# Patient Record
Sex: Female | Born: 1945
Health system: Southern US, Community
[De-identification: ages and names within clinical notes are randomized; demographics above are authoritative.]

## PROBLEM LIST (undated history)

## (undated) DIAGNOSIS — E785 Hyperlipidemia, unspecified: Secondary | ICD-10-CM

## (undated) DIAGNOSIS — M810 Age-related osteoporosis without current pathological fracture: Secondary | ICD-10-CM

## (undated) DIAGNOSIS — E1165 Type 2 diabetes mellitus with hyperglycemia: Secondary | ICD-10-CM

## (undated) DIAGNOSIS — IMO0002 Reserved for concepts with insufficient information to code with codable children: Secondary | ICD-10-CM

## (undated) DIAGNOSIS — F0390 Unspecified dementia without behavioral disturbance: Secondary | ICD-10-CM

## (undated) DIAGNOSIS — I10 Essential (primary) hypertension: Secondary | ICD-10-CM

## (undated) HISTORY — DX: Type 2 diabetes mellitus with hyperglycemia: E11.65

## (undated) HISTORY — DX: Age-related osteoporosis without current pathological fracture: M81.0

## (undated) HISTORY — DX: Unspecified dementia, unspecified severity, without behavioral disturbance, psychotic disturbance, mood disturbance, and anxiety: F03.90

## (undated) HISTORY — DX: Essential (primary) hypertension: I10

## (undated) HISTORY — PX: ANKLE SURGERY: SHX546

## (undated) HISTORY — DX: Hyperlipidemia, unspecified: E78.5

## (undated) HISTORY — PX: OTHER SURGICAL HISTORY: SHX169

## (undated) HISTORY — DX: Reserved for concepts with insufficient information to code with codable children: IMO0002

---

## 2017-05-23 ENCOUNTER — Encounter: Payer: Self-pay | Admitting: Adult Health

## 2017-05-23 ENCOUNTER — Ambulatory Visit (INDEPENDENT_AMBULATORY_CARE_PROVIDER_SITE_OTHER): Payer: Medicare Other | Admitting: Adult Health

## 2017-05-23 VITALS — BP 144/72 | Temp 98.8°F | Ht 62.0 in | Wt 127.0 lb

## 2017-05-23 DIAGNOSIS — Z1211 Encounter for screening for malignant neoplasm of colon: Secondary | ICD-10-CM

## 2017-05-23 DIAGNOSIS — Z23 Encounter for immunization: Secondary | ICD-10-CM | POA: Diagnosis not present

## 2017-05-23 DIAGNOSIS — Z780 Asymptomatic menopausal state: Secondary | ICD-10-CM | POA: Diagnosis not present

## 2017-05-23 DIAGNOSIS — Z0000 Encounter for general adult medical examination without abnormal findings: Secondary | ICD-10-CM

## 2017-05-23 NOTE — Patient Instructions (Signed)
It was great meeting you today   Someone will call you to schedule your colonoscopy   Please call the breast center to schedule your mammogram and bone density screen   Follow up with me at your convenience for your physical

## 2017-05-23 NOTE — Progress Notes (Signed)
Patient presents to clinic today to establish care. She is a pleasant 71 year old female who  has no past medical history on file.   She has not been seen by primary care in " many years"    Acute Concerns: Establish Care   Chronic Issues: None   Health Maintenance: Dental -- Does not do routine care  Vision -- Does not do routine care.  Immunizations -- Due for Pneumonia Vaccination.  Colonoscopy --Never had  Mammogram -- Never had  PAP -- ?   Bone Density -- Never had  Diet: Tries to eat healthy. Does not eat a lot of red meat  Exercise: Does not exercise on a regular basis but stay active   History reviewed. No pertinent past medical history.  Past Surgical History:  Procedure Laterality Date  . ANKLE SURGERY Right   . pilonidal cyst      Current Outpatient Medications on File Prior to Visit  Medication Sig Dispense Refill  . aspirin EC 81 MG tablet Take 81 mg daily by mouth.    . Biotin 4782910000 MCG TABS Take 1 tablet daily by mouth.    . Calcium Carbonate-Vit D-Min (CALCIUM-VITAMIN D-MINERALS) 600-800 MG-UNIT CHEW Chew 1 tablet daily by mouth.    . DHA-EPA-Vitamin E (OMEGA-3 COMPLEX PO) Take 1 tablet daily by mouth.    Marland Kitchen. GLUCOSAMINE-CHONDROITIN PO Take by mouth. 1500 mg glucosamine and 1200 mg of choindroitan    . Multiple Vitamins-Minerals (MULTIVITAMIN WOMEN 50+) TABS Take 1 tablet daily by mouth.    . vitamin E 400 UNIT capsule Take 400 Units daily by mouth.     No current facility-administered medications on file prior to visit.     No Known Allergies  Family History  Problem Relation Age of Onset  . Diabetes Mother   . Hypertension Mother   . Hypertension Father   . Heart attack Father   . Hypertension Sister   . Diabetes Brother   . Diabetes Sister     Social History   Socioeconomic History  . Marital status: Unknown    Spouse name: Not on file  . Number of children: Not on file  . Years of education: Not on file  . Highest education level:  Not on file  Social Needs  . Financial resource strain: Not on file  . Food insecurity - worry: Not on file  . Food insecurity - inability: Not on file  . Transportation needs - medical: Not on file  . Transportation needs - non-medical: Not on file  Occupational History  . Not on file  Tobacco Use  . Smoking status: Former Smoker    Packs/day: 2.00    Types: Cigarettes    Start date: 07/11/1964    Last attempt to quit: 07/11/1976    Years since quitting: 40.8  . Smokeless tobacco: Never Used  Substance and Sexual Activity  . Alcohol use: No    Frequency: Never  . Drug use: Not on file  . Sexual activity: Not on file  Other Topics Concern  . Not on file  Social History Narrative   Retired    Not married    No kids           Review of Systems  Constitutional: Negative.   HENT: Negative.   Eyes: Negative.   Respiratory: Negative.   Cardiovascular: Negative.   Gastrointestinal: Negative.   Genitourinary: Negative.   Musculoskeletal: Negative.   Skin: Negative.   Neurological: Negative.  Endo/Heme/Allergies: Negative.     BP (!) 144/72 (BP Location: Left Arm)   Temp 98.8 F (37.1 C) (Oral)   Ht 5\' 2"  (1.575 m)   Wt 127 lb (57.6 kg)   BMI 23.23 kg/m   Physical Exam  Constitutional: She is oriented to person, place, and time and well-developed, well-nourished, and in no distress. No distress.  HENT:  Head: Normocephalic and atraumatic.  Right Ear: External ear normal.  Left Ear: External ear normal.  Nose: Nose normal.  Mouth/Throat: Oropharynx is clear and moist. No oropharyngeal exudate.  Eyes: Conjunctivae and EOM are normal. Pupils are equal, round, and reactive to light. Right eye exhibits no discharge. Left eye exhibits no discharge. No scleral icterus.  Neck: Normal range of motion. Neck supple. No JVD present. No tracheal deviation present. No thyromegaly present.  Cardiovascular: Normal rate, regular rhythm, normal heart sounds and intact distal pulses.  Exam reveals no gallop and no friction rub.  No murmur heard. Pulmonary/Chest: Effort normal and breath sounds normal. No stridor. No respiratory distress. She has no wheezes. She has no rales. She exhibits no tenderness.  Abdominal: Soft. Bowel sounds are normal. She exhibits no distension and no mass. There is no tenderness. There is no rebound and no guarding.  Musculoskeletal: Normal range of motion. She exhibits no edema, tenderness or deformity.  Lymphadenopathy:    She has no cervical adenopathy.  Neurological: She is alert and oriented to person, place, and time. She displays normal reflexes. No cranial nerve deficit. She exhibits normal muscle tone. Gait normal. Coordination normal. GCS score is 15.  Skin: Skin is warm and dry. No rash noted. She is not diaphoretic. No erythema. No pallor.  Psychiatric: Mood, memory, affect and judgment normal.  Nursing note and vitals reviewed.  Assessment/Plan: 1. Encounter for medical examination to establish care - Follow up for CPE  - Follow up sooner with any acute issues  - Continue to stay active and eat healthy  - Recommend some type of weight training for bone health   2. Screen for colon cancer  - Ambulatory referral to Gastroenterology  3. Post-menopausal  - DG Bone Density; Future - MM DIGITAL SCREENING BILATERAL; Future  4. Need for vaccination against Streptococcus pneumoniae  - Pneumococcal conjugate vaccine 13-valent    Shirline Freesory Yazlin Ekblad, NP

## 2017-05-24 ENCOUNTER — Other Ambulatory Visit: Payer: Self-pay | Admitting: Adult Health

## 2017-05-24 DIAGNOSIS — E2839 Other primary ovarian failure: Secondary | ICD-10-CM

## 2017-05-24 DIAGNOSIS — Z1231 Encounter for screening mammogram for malignant neoplasm of breast: Secondary | ICD-10-CM

## 2017-06-20 ENCOUNTER — Encounter: Payer: Medicare Other | Admitting: Adult Health

## 2017-06-27 ENCOUNTER — Ambulatory Visit
Admission: RE | Admit: 2017-06-27 | Discharge: 2017-06-27 | Disposition: A | Discharge status: Home / Self Care | Payer: Medicare Other | Source: Ambulatory Visit | Attending: Adult Health | Admitting: Adult Health

## 2017-06-27 DIAGNOSIS — E2839 Other primary ovarian failure: Secondary | ICD-10-CM

## 2017-06-27 DIAGNOSIS — Z1231 Encounter for screening mammogram for malignant neoplasm of breast: Secondary | ICD-10-CM

## 2017-06-28 ENCOUNTER — Encounter: Payer: Self-pay | Admitting: Adult Health

## 2017-07-26 ENCOUNTER — Telehealth: Payer: Self-pay | Admitting: Adult Health

## 2017-07-26 NOTE — Telephone Encounter (Signed)
Noted  

## 2017-07-26 NOTE — Telephone Encounter (Signed)
Called pt to schedule for Prolia injection. Pt's summary of benefits shows that pt is responsible for 20% of the cost of the injection (about $230) every 6 months.    Pt may apply for a grant with [employer] [phone]  Left message on voicemail to call office. Will follow-up with pt at a later time.

## 2017-07-26 NOTE — Telephone Encounter (Signed)
Sir please note below message.

## 2017-07-26 NOTE — Telephone Encounter (Signed)
Copied from CRM [phone]. Topic: Quick Communication - Office Called Patient >> Jul 26, 2017  4:17 PM Susan Schmidt, Susan Schmidt wrote: Reason for CRM: Patient returned office called but she have decide not to take the Prolia injection. She stated that she read up on the injection and it has too many side effect.

## 2017-07-26 NOTE — Telephone Encounter (Signed)
For review and [facility] to [facility].

## 2018-01-01 ENCOUNTER — Other Ambulatory Visit: Payer: Self-pay | Admitting: Adult Health

## 2018-01-01 ENCOUNTER — Ambulatory Visit (INDEPENDENT_AMBULATORY_CARE_PROVIDER_SITE_OTHER): Payer: Medicare Other | Admitting: Adult Health

## 2018-01-01 ENCOUNTER — Encounter: Payer: Self-pay | Admitting: Adult Health

## 2018-01-01 VITALS — BP 160/90 | Temp 98.6°F | Ht 61.75 in | Wt 128.0 lb

## 2018-01-01 DIAGNOSIS — R7309 Other abnormal glucose: Secondary | ICD-10-CM

## 2018-01-01 DIAGNOSIS — Z1159 Encounter for screening for other viral diseases: Secondary | ICD-10-CM | POA: Diagnosis not present

## 2018-01-01 DIAGNOSIS — Z1211 Encounter for screening for malignant neoplasm of colon: Secondary | ICD-10-CM

## 2018-01-01 DIAGNOSIS — I10 Essential (primary) hypertension: Secondary | ICD-10-CM | POA: Diagnosis not present

## 2018-01-01 DIAGNOSIS — M810 Age-related osteoporosis without current pathological fracture: Secondary | ICD-10-CM

## 2018-01-01 DIAGNOSIS — Z0000 Encounter for general adult medical examination without abnormal findings: Secondary | ICD-10-CM

## 2018-01-01 LAB — BASIC METABOLIC PANEL
BUN: 8 mg/dL (ref 6–23)
CALCIUM: 10 mg/dL (ref 8.4–10.5)
CHLORIDE: 100 meq/L (ref 96–112)
CO2: 30 mEq/L (ref 19–32)
CREATININE: 0.67 mg/dL (ref 0.40–1.20)
GFR: 91.6 mL/min (ref >60.00)
Glucose, Bld: 152 mg/dL — ABNORMAL HIGH (ref 70–99)
Potassium: 4.3 mEq/L (ref 3.5–5.1)
Sodium: 138 mEq/L (ref 135–145)

## 2018-01-01 LAB — LIPID PANEL
CHOL/HDL RATIO: 4
CHOLESTEROL: 211 mg/dL — AB (ref 0–200)
HDL: 58.6 mg/dL (ref >39.00)
LDL CALC: 119 mg/dL — AB (ref 0–99)
NonHDL: 151.9
Triglycerides: 167 mg/dL — ABNORMAL HIGH (ref 0.0–149.0)
VLDL: 33.4 mg/dL (ref 0.0–40.0)

## 2018-01-01 LAB — HEPATIC FUNCTION PANEL
ALT: 18 U/L (ref 0–35)
AST: 20 U/L (ref 0–37)
Albumin: 4.4 g/dL (ref 3.5–5.2)
Alkaline Phosphatase: 64 U/L (ref 39–117)
BILIRUBIN DIRECT: 0.1 mg/dL (ref 0.0–0.3)
TOTAL PROTEIN: 7.6 g/dL (ref 6.0–8.3)
Total Bilirubin: 0.6 mg/dL (ref 0.2–1.2)

## 2018-01-01 LAB — CBC WITH DIFFERENTIAL/PLATELET
BASOS PCT: 0.5 % (ref 0.0–3.0)
Basophils Absolute: 0 10*3/uL (ref 0.0–0.1)
EOS ABS: 0 10*3/uL (ref 0.0–0.7)
Eosinophils Relative: 0.6 % (ref 0.0–5.0)
HCT: 42.8 % (ref 36.0–46.0)
HEMOGLOBIN: 14.9 g/dL (ref 12.0–15.0)
Lymphocytes Relative: 32.2 % (ref 12.0–46.0)
Lymphs Abs: 2.4 10*3/uL (ref 0.7–4.0)
MCHC: 34.9 g/dL (ref 30.0–36.0)
MCV: 90.5 fl (ref 78.0–100.0)
MONO ABS: 0.5 10*3/uL (ref 0.1–1.0)
Monocytes Relative: 6.2 % (ref 3.0–12.0)
Neutro Abs: 4.4 10*3/uL (ref 1.4–7.7)
Neutrophils Relative %: 60.5 % (ref 43.0–77.0)
Platelets: 199 10*3/uL (ref 150.0–400.0)
RBC: 4.73 Mil/uL (ref 3.87–5.11)
RDW: 12.6 % (ref 11.5–15.5)
WBC: 7.3 10*3/uL (ref 4.0–10.5)

## 2018-01-01 LAB — VITAMIN D 25 HYDROXY (VIT D DEFICIENCY, FRACTURES): VITD: 51.78 ng/mL (ref 30.00–100.00)

## 2018-01-01 LAB — TSH: TSH: 3.33 u[IU]/mL (ref 0.35–4.50)

## 2018-01-01 NOTE — Progress Notes (Signed)
Subjective:    Patient ID: Susan Schmidt, female    DOB: May 02, 1946, 72 y.o.   MRN: [id number]  HPI Patient presents for yearly preventative medicine examination. She is a pleasant 72 year old female who  has a past medical history of Osteoporosis.  Osteoporosis - Dexa Scan in 2018 showed the BMD measured at [facility] Spine L2-L4 is 0.730 g/cm2 with a T-score of -3.9.  Is recommended to start Prolia, she ultimately ended up deciding to not forego this treatment due to the side effects of the medication. She is taking Vitamin D 1000 units daily.   All immunizations and health maintenance protocols were reviewed with the patient and needed orders were placed. She is in need of tetanus booster but insurance will not cover.   Appropriate screening laboratory values were ordered for the patient including screening of hyperlipidemia, renal function and hepatic function.  Medication reconciliation,  past medical history, social history, problem list and allergies were reviewed in detail with the patient  Goals were established with regard to weight loss, exercise, and  diet in compliance with medications. She eats a healthy diet and is active with yard work. She does not do formal exercising.   End of life planning was discussed.  She does not participate in routine dental or vision screens.  Her mammogram in 2018 which was negative for malignancy.  She has no acute complaints.   Review of Systems  Constitutional: Negative.   HENT: Negative.   Eyes: Negative.   Respiratory: Negative.   Cardiovascular: Negative.   Gastrointestinal: Negative.   Endocrine: Negative.   Genitourinary: Negative.   Musculoskeletal: Negative.   Skin: Negative.   Allergic/Immunologic: Negative.   Neurological: Negative.   Hematological: Negative.   Psychiatric/Behavioral: Negative.    Past Medical History:  Diagnosis Date  . Osteoporosis     Social History   Socioeconomic History  . Marital status:  Single    Spouse name: Not on file  . Number of children: Not on file  . Years of education: Not on file  . Highest education level: Not on file  Occupational History  . Not on file  Social Needs  . Financial resource strain: Not on file  . Food insecurity:    Worry: Not on file    Inability: Not on file  . Transportation needs:    Medical: Not on file    Non-medical: Not on file  Tobacco Use  . Smoking status: Former Smoker    Packs/day: 2.00    Types: Cigarettes    Start date: 07/11/1964    Last attempt to quit: 07/11/1976    Years since quitting: 41.5  . Smokeless tobacco: Never Used  Substance and Sexual Activity  . Alcohol use: No    Frequency: Never  . Drug use: Not on file  . Sexual activity: Not on file  Lifestyle  . Physical activity:    Days per week: Not on file    Minutes per session: Not on file  . Stress: Not on file  Relationships  . Social connections:    Talks on phone: Not on file    Gets together: Not on file    Attends religious service: Not on file    Active member of club or organization: Not on file    Attends meetings of clubs or organizations: Not on file    Relationship status: Not on file  . Intimate partner violence:    Fear of current or ex partner:  Not on file    Emotionally abused: Not on file    Physically abused: Not on file    Forced sexual activity: Not on file  Other Topics Concern  . Not on file  Social History Narrative   Retired    Not married    No kids           Past Surgical History:  Procedure Laterality Date  . ANKLE SURGERY Right   . pilonidal cyst      Family History  Problem Relation Age of Onset  . Diabetes Mother   . Hypertension Mother   . Hypertension Father   . Heart attack Father   . Hypertension Sister   . Diabetes Brother   . Diabetes Sister     No Known Allergies  Current Outpatient Medications on File Prior to Visit  Medication Sig Dispense Refill  . aspirin EC 81 MG tablet Take 81 mg  daily by mouth.    . Biotin 40981 MCG TABS Take 1 tablet daily by mouth.    . Calcium Carbonate-Vit D-Min (CALCIUM-VITAMIN D-MINERALS) 600-800 MG-UNIT CHEW Chew 1 tablet daily by mouth.    . Cholecalciferol (VITAMIN D3) 1000 units CAPS Take 1 capsule by mouth daily.    Marland Kitchen GLUCOSAMINE-CHONDROITIN PO Take by mouth. 1500 mg glucosamine and 1200 mg of choindroitan    . Multiple Vitamins-Minerals (MULTIVITAMIN WOMEN 50+) TABS Take 1 tablet daily by mouth.    . Omega-3 Fatty Acids (SUPER OMEGA 3 EPA/DHA PO) Take 660 mg by mouth daily.    . vitamin E 400 UNIT capsule Take 400 Units daily by mouth.     No current facility-administered medications on file prior to visit.     BP (!) 160/90   Temp 98.6 F (37 C) (Oral)   Ht 5' 1.75" (1.568 m)   Wt 128 lb (58.1 kg)   BMI 23.60 kg/m       Objective:   Physical Exam  Constitutional: She is oriented to person, place, and time. She appears well-developed and well-nourished. No distress.  HENT:  Head: Normocephalic and atraumatic.  Right Ear: External ear normal.  Left Ear: External ear normal.  Nose: Nose normal.  Mouth/Throat: Oropharynx is clear and moist. Abnormal dentition. No oropharyngeal exudate.  Eyes: Pupils are equal, round, and reactive to light. Conjunctivae and EOM are normal. Right eye exhibits no discharge. Left eye exhibits no discharge. No scleral icterus.  Neck: Normal range of motion. Neck supple. No JVD present. No tracheal deviation present. No thyromegaly present.  Cardiovascular: Normal rate, regular rhythm, normal heart sounds and intact distal pulses. Exam reveals no gallop and no friction rub.  No murmur heard. Pulmonary/Chest: Effort normal and breath sounds normal. No stridor. No respiratory distress. She has no wheezes. She has no rales. She exhibits no mass and no tenderness. Right breast exhibits no inverted nipple, no mass, no nipple discharge, no skin change and no tenderness. Left breast exhibits no inverted  nipple, no mass, no nipple discharge and no tenderness.  Abdominal: Soft. Bowel sounds are normal. She exhibits no distension and no mass. There is no tenderness. There is no rebound and no guarding. No hernia.  Musculoskeletal: Normal range of motion. She exhibits no edema, tenderness or deformity.  Lymphadenopathy:    She has no cervical adenopathy.  Neurological: She is alert and oriented to person, place, and time. She displays normal reflexes. No cranial nerve deficit or sensory deficit. She exhibits normal muscle tone. Coordination normal.  Skin:  Skin is warm and dry. Capillary refill takes less than 2 seconds. No rash noted. She is not diaphoretic. No erythema. No pallor.  Psychiatric: She has a normal mood and affect. Her behavior is normal. Judgment and thought content normal.  Nursing note and vitals reviewed.     Assessment & Plan:  1. Routine general medical examination at a health care facility - Encouraged heart healthy diet and exercise  - Follow up in one year or sooner if needed - Basic metabolic panel - CBC with Differential/Platelet - Hepatic function panel - Lipid panel - TSH  2. Age-related osteoporosis without current pathological fracture  - TSH - Vitamin D, 25-hydroxy  3. Colon cancer screening  - Ambulatory referral to Gastroenterology  4. Need for hepatitis C screening test  - Hep C Antibody  5. Essential hypertension - She is going to monitor her BP at home over the next two weeks and send me the results via mychart.  - Consider adding agent  - Basic metabolic panel - CBC with Differential/Platelet - Hepatic function panel - Lipid panel - TSH  Shirline Freesory Juletta Berhe, NP

## 2018-01-01 NOTE — Patient Instructions (Signed)
It was great seeing you today  I will follow up with you regarding your blood work   Please get a blood pressure cuff and monitor your blood pressure at home, twice a day. Please send me the results via Mychart in two weeks   If you need anything, please let me know

## 2018-01-01 NOTE — Addendum Note (Signed)
Addended by: Laure KidneyLARK, Reino Lybbert J on: 01/01/2018 05:13 PM   Modules accepted: Orders

## 2018-01-02 ENCOUNTER — Other Ambulatory Visit (INDEPENDENT_AMBULATORY_CARE_PROVIDER_SITE_OTHER): Payer: Medicare Other

## 2018-01-02 ENCOUNTER — Other Ambulatory Visit: Payer: Self-pay | Admitting: Family Medicine

## 2018-01-02 DIAGNOSIS — R7309 Other abnormal glucose: Secondary | ICD-10-CM | POA: Diagnosis not present

## 2018-01-02 LAB — HEPATITIS C ANTIBODY
Hepatitis C Ab: NONREACTIVE
SIGNAL TO CUT-OFF: 0.02 (ref <1.00)

## 2018-01-02 LAB — HEMOGLOBIN A1C: HEMOGLOBIN A1C: 7.6 % — AB (ref 4.6–6.5)

## 2018-01-02 MED ORDER — METFORMIN HCL 500 MG PO TABS: 500.0000 mg | ORAL_TABLET | Freq: Every day | ORAL | 0 refills | Status: DC

## 2018-02-21 ENCOUNTER — Telehealth: Payer: Self-pay | Admitting: Adult Health

## 2018-02-21 DIAGNOSIS — R7309 Other abnormal glucose: Secondary | ICD-10-CM

## 2018-02-21 NOTE — Telephone Encounter (Signed)
Copied from CRM [id number]. Topic: Quick Communication - See Telephone Encounter >> Feb 21, 2018  3:41 PM Lorrine KinMcGee, Whittney Steenson B, [place] wrote: CRM for notification. See Telephone encounter for: 02/21/18. Patient calling and states that Kandee KeenCory wanted her to get back with him on her blood pressures. States that she had forgotten to get back with him, but wanted to let him know what the last 2 weeks of blood pressures were. States that she takes the blood pressures in the morning and then again in the evening. 02/08/18- 130/65 125/61 02/09/18- 124/63 135/67 02/10/18- 128/61 124/60 02/11/18- 143/66 123/65 02/12/18- 135/66 147/71 02/13/18- 132/68 120/62 02/14/18- 128/65 139/66 02/15/18- 131/66 128/63 02/16/18- 128/66 137/69 02/17/18- 129/63 118/60 02/18/18- 134/62 121/61 02/19/18- 136/66 142/66 02/20/18- 140/63 136/67 02/21/18- 135/67 131/66   Patient is also requesting a prescription for a glucometer and glucose testing supplies be written so she could check her blood sugar at home. Please advise. [facility] - [place] - [address]

## 2018-02-22 NOTE — Telephone Encounter (Signed)
Patient returning call to Misty. Please advise.  

## 2018-02-22 NOTE — Telephone Encounter (Signed)
BP at home is ok, do not need to start on medication   OK to send in glucometer and testing strips. Test BID

## 2018-02-22 NOTE — Telephone Encounter (Signed)
Left a message for a return call.

## 2018-02-22 NOTE — Telephone Encounter (Signed)
Pt called back stating that her insurance will cover accu-check and one touch. She stated that she prefers one touch. Insurance will cover a 90 day supply.

## 2018-02-22 NOTE — Telephone Encounter (Signed)
Spoke to the pt and informed her of Cory's message.  She will now call her insurance company to find out which glucometer and test strips are covered.  Will call back.

## 2018-02-23 MED ORDER — GLUCOSE BLOOD VI STRP: ORAL_STRIP | 12 refills | Status: DC

## 2018-02-23 MED ORDER — ONETOUCH ULTRASOFT LANCETS MISC: 12 refills | Status: DC

## 2018-02-23 MED ORDER — ONETOUCH ULTRA 2 W/DEVICE KIT: PACK | 0 refills | Status: DC

## 2018-02-23 NOTE — Telephone Encounter (Signed)
rx for the testing supplies have been sent to the pharmacy.  Nothing further is needed.

## 2018-02-23 NOTE — Addendum Note (Signed)
Addended by: Marcellus ScottADKINS, Dominiq Fontaine L on: 02/23/2018 08:41 AM   Modules accepted: Orders

## 2018-03-27 ENCOUNTER — Ambulatory Visit (INDEPENDENT_AMBULATORY_CARE_PROVIDER_SITE_OTHER): Payer: Medicare Other | Admitting: Adult Health

## 2018-03-27 ENCOUNTER — Encounter: Payer: Self-pay | Admitting: Adult Health

## 2018-03-27 DIAGNOSIS — E785 Hyperlipidemia, unspecified: Secondary | ICD-10-CM | POA: Insufficient documentation

## 2018-03-27 DIAGNOSIS — E1165 Type 2 diabetes mellitus with hyperglycemia: Secondary | ICD-10-CM

## 2018-03-27 DIAGNOSIS — E782 Mixed hyperlipidemia: Secondary | ICD-10-CM

## 2018-03-27 DIAGNOSIS — IMO0002 Reserved for concepts with insufficient information to code with codable children: Secondary | ICD-10-CM | POA: Insufficient documentation

## 2018-03-27 LAB — BASIC METABOLIC PANEL
BUN: 8 mg/dL (ref 6–23)
CALCIUM: 10.1 mg/dL (ref 8.4–10.5)
CO2: 26 meq/L (ref 19–32)
Chloride: 101 mEq/L (ref 96–112)
Creatinine, Ser: 0.59 mg/dL (ref 0.40–1.20)
GFR: 106.01 mL/min (ref >60.00)
Glucose, Bld: 121 mg/dL — ABNORMAL HIGH (ref 70–99)
Potassium: 4.7 mEq/L (ref 3.5–5.1)
SODIUM: 137 meq/L (ref 135–145)

## 2018-03-27 LAB — LIPID PANEL
CHOLESTEROL: 188 mg/dL (ref 0–200)
HDL: 52 mg/dL (ref >39.00)
LDL CALC: 114 mg/dL — AB (ref 0–99)
NonHDL: 135.5
Total CHOL/HDL Ratio: 4
Triglycerides: 109 mg/dL (ref 0.0–149.0)
VLDL: 21.8 mg/dL (ref 0.0–40.0)

## 2018-03-27 LAB — HEMOGLOBIN A1C: HEMOGLOBIN A1C: 5.9 % (ref 4.6–6.5)

## 2018-03-27 NOTE — Progress Notes (Signed)
Subjective:    Patient ID: Susan Schmidt, female    DOB: 04-12-1946, 72 y.o.   MRN: [id number]  HPI  72 year old female who  has a past medical history of DM (diabetes mellitus), type 2, uncontrolled ([place]), Hyperlipidemia, and Osteoporosis.  She presents to the office today for 69-monthfollow-up regarding new diagnosis of diabetes and hyperlipidemia.  During her CPE, approximately 3 months ago she was found to have an A1c of 7.6 -she was subsequently started on metformin 500 mg daily with breakfast.  She was advised to monitor her blood sugars at home and started eating a heart healthy diet.  Today in the office she reports that she has had no side effects with metformin. She is monitoring her blood sugars at home and reports readings between 80 -150.   She has been working on diet and has cut back on sugars and carbs.  She is exercising on a regular basis ( walking about a mile).   Wt Readings from Last 3 Encounters:  03/27/18 115 lb (52.2 kg)  01/01/18 128 lb (58.1 kg)  05/23/17 127 lb (57.6 kg)    Review of Systems See HPI   Past Medical History:  Diagnosis Date  . DM (diabetes mellitus), [place] 2, [place]   . Hyperlipidemia   . Osteoporosis     Social History   Socioeconomic History  . Marital status: Single    Spouse name: Not on file  . Number of children: Not on file  . Years of education: Not on file  . Highest education level: Not on file  Occupational History  . Not on file  Social Needs  . Financial resource strain: Not on file  . Food insecurity:    Worry: Not on file    Inability: Not on file  . Transportation needs:    Medical: Not on file    Non-medical: Not on file  Tobacco Use  . Smoking status: Former Smoker    Packs/day: 2.00    Types: Cigarettes    Start date: 07/11/1964    Last attempt to quit: 07/11/1976    Years since quitting: 41.7  . Smokeless tobacco: Never Used  Substance and Sexual Activity  . Alcohol use: No   Frequency: Never  . Drug use: Not on file  . Sexual activity: Not on file  Lifestyle  . Physical activity:    Days per week: Not on file    Minutes per session: Not on file  . Stress: Not on file  Relationships  . Social connections:    Talks on phone: Not on file    Gets together: Not on file    Attends religious service: Not on file    Active member of club or organization: Not on file    Attends meetings of clubs or organizations: Not on file    Relationship status: Not on file  . Intimate partner violence:    Fear of current or ex partner: Not on file    Emotionally abused: Not on file    Physically abused: Not on file    Forced sexual activity: Not on file  Other Topics Concern  . Not on file  Social History Narrative   Retired    Not married    No kids           Past Surgical History:  Procedure Laterality Date  . ANKLE SURGERY Right   . pilonidal cyst      Family History  Problem Relation Age of Onset  . Diabetes Mother   . Hypertension Mother   . Hypertension Father   . Heart attack Father   . Hypertension Sister   . Diabetes Brother   . Diabetes Sister     No Known Allergies  Current Outpatient Medications on File Prior to Visit  Medication Sig Dispense Refill  . aspirin EC 81 MG tablet Take 81 mg daily by mouth.    . Biotin 10000 MCG TABS Take 1 tablet daily by mouth.    . Blood Glucose Monitoring Suppl (ONE TOUCH ULTRA 2) w/Device KIT Test blood sugar twice daily 1 each 0  . Calcium Carbonate-Vit D-Min (CALCIUM-VITAMIN D-MINERALS) 600-800 MG-UNIT CHEW Chew 1 tablet daily by mouth.    . Cholecalciferol (VITAMIN D3) 1000 units CAPS Take 1 capsule by mouth daily.    Marland Kitchen GLUCOSAMINE-CHONDROITIN PO Take by mouth. 1500 mg glucosamine and 1200 mg of choindroitan    . glucose blood (ONE TOUCH ULTRA TEST) test strip Test blood sugar twice daily 100 each 12  . Lancets (ONETOUCH ULTRASOFT) lancets Use as instructed 100 each 12  . metFORMIN (GLUCOPHAGE) 500 MG  tablet Take 1 tablet (500 mg total) by mouth daily with breakfast. 90 tablet 0  . Multiple Vitamins-Minerals (MULTIVITAMIN WOMEN 50+) TABS Take 1 tablet daily by mouth.    . Omega-3 Fatty Acids (SUPER OMEGA 3 EPA/DHA PO) Take 660 mg by mouth daily.    . vitamin E 400 UNIT capsule Take 400 Units daily by mouth.     No current facility-administered medications on file prior to visit.     There were no vitals taken for this visit.  BP Readings from Last 3 Encounters:  03/27/18 128/76  01/01/18 (!) 160/90  05/23/17 (!) 144/72       Objective:   Physical Exam  Constitutional: She is oriented to person, place, and time. She appears well-developed and well-nourished. No distress.  Cardiovascular: Normal rate, regular rhythm, normal heart sounds and intact distal pulses.  Pulmonary/Chest: Effort normal and breath sounds normal.  Musculoskeletal: Normal range of motion.  Neurological: She is alert and oriented to person, place, and time.  Skin: Skin is warm and dry. She is not diaphoretic.  Psychiatric: She has a normal mood and affect. Her behavior is normal. Judgment and thought content normal.  Vitals reviewed.     Assessment & Plan:  1. Uncontrolled type 2 diabetes mellitus with hyperglycemia (HCC) - Hemoglobin V7Q - Basic Metabolic Panel  2. Mixed hyperlipidemia  - Lipid panel  Dorothyann Peng, NP

## 2018-03-28 ENCOUNTER — Other Ambulatory Visit: Payer: Self-pay | Admitting: Adult Health

## 2018-03-28 MED ORDER — METFORMIN HCL 500 MG PO TABS: 500.0000 mg | ORAL_TABLET | Freq: Every day | ORAL | 0 refills | Status: DC

## 2018-03-28 NOTE — Telephone Encounter (Signed)
Sent to the pharmacy by e-scribe. 

## 2018-05-29 ENCOUNTER — Other Ambulatory Visit: Payer: Self-pay | Admitting: Adult Health

## 2018-05-29 DIAGNOSIS — Z1231 Encounter for screening mammogram for malignant neoplasm of breast: Secondary | ICD-10-CM

## 2018-06-25 ENCOUNTER — Ambulatory Visit (INDEPENDENT_AMBULATORY_CARE_PROVIDER_SITE_OTHER): Payer: Medicare Other | Admitting: Adult Health

## 2018-06-25 ENCOUNTER — Encounter: Payer: Self-pay | Admitting: Adult Health

## 2018-06-25 VITALS — BP 140/70 | Temp 98.3°F | Wt 104.0 lb

## 2018-06-25 DIAGNOSIS — E1165 Type 2 diabetes mellitus with hyperglycemia: Secondary | ICD-10-CM

## 2018-06-25 LAB — POCT GLYCOSYLATED HEMOGLOBIN (HGB A1C): HBA1C, POC (CONTROLLED DIABETIC RANGE): 5.4 % (ref 0.0–7.0)

## 2018-06-25 MED ORDER — METFORMIN HCL 500 MG PO TABS: 250.0000 mg | ORAL_TABLET | Freq: Every day | ORAL | 1 refills | Status: DC

## 2018-06-25 NOTE — Progress Notes (Signed)
Subjective:    Patient ID: Susan Schmidt, female    DOB: 1945/09/05, 72 y.o.   MRN: [id number]  HPI 72 year old female who  has a past medical history of DM (diabetes mellitus), type 2, uncontrolled ([place]), Hyperlipidemia, and Osteoporosis.  She presents to the office today for three month follow up regarding diabetes .  Is currently maintained on metformin 500 mg daily.  Her last A1c was 5.9, this was down from 7.6 - 3 months prior.  She has been monitoring her blood sugars at home and reports readings between 80 and 130. She has had one reading of 200.     She denies any side effects of metformin but does report frequent episodes of feeling as though her blood sugars have dropped too low.   She has been working on diet and has cut back on sugars and carbs.    Wt Readings from Last 3 Encounters:  06/25/18 104 lb (47.2 kg)  03/27/18 115 lb (52.2 kg)  01/01/18 128 lb (58.1 kg)    Review of Systems See HPI   Past Medical History:  Diagnosis Date  . DM (diabetes mellitus), type 2, uncontrolled ([place])   . Hyperlipidemia   . Osteoporosis     Social History   Socioeconomic History  . Marital status: Single    Spouse name: Not on file  . Number of children: Not on file  . Years of education: Not on file  . Highest education level: Not on file  Occupational History  . Not on file  Social Needs  . Financial resource strain: Not on file  . Food insecurity:    Worry: Not on file    Inability: Not on file  . Transportation needs:    Medical: Not on file    Non-medical: Not on file  Tobacco Use  . Smoking status: Former Smoker    Packs/day: 2.00    Types: Cigarettes    Start date: 07/11/1964    Last attempt to quit: 07/11/1976    Years since quitting: 41.9  . Smokeless tobacco: Never Used  Substance and Sexual Activity  . Alcohol use: No    Frequency: Never  . Drug use: Not on file  . Sexual activity: Not on file  Lifestyle  . Physical activity:    Days per week:  Not on file    Minutes per session: Not on file  . Stress: Not on file  Relationships  . Social connections:    Talks on phone: Not on file    Gets together: Not on file    Attends religious service: Not on file    Active member of club or organization: Not on file    Attends meetings of clubs or organizations: Not on file    Relationship status: Not on file  . Intimate partner violence:    Fear of current or ex partner: Not on file    Emotionally abused: Not on file    Physically abused: Not on file    Forced sexual activity: Not on file  Other Topics Concern  . Not on file  Social History Narrative   Retired    Not married    No kids           Past Surgical History:  Procedure Laterality Date  . ANKLE SURGERY Right   . pilonidal cyst      Family History  Problem Relation Age of Onset  . Diabetes Mother   . Hypertension  Mother   . Hypertension Father   . Heart attack Father   . Hypertension Sister   . Diabetes Brother   . Diabetes Sister     No Known Allergies  Current Outpatient Medications on File Prior to Visit  Medication Sig Dispense Refill  . aspirin EC 81 MG tablet Take 81 mg daily by mouth.    . Biotin 10000 MCG TABS Take 1 tablet daily by mouth.    . Blood Glucose Monitoring Suppl (ONE TOUCH ULTRA 2) w/Device KIT Test blood sugar twice daily 1 each 0  . Calcium Carbonate-Vit D-Min (CALCIUM-VITAMIN D-MINERALS) 600-800 MG-UNIT CHEW Chew 1 tablet daily by mouth.    . Cholecalciferol (VITAMIN D3) 1000 units CAPS Take 1 capsule by mouth daily.    Marland Kitchen GLUCOSAMINE-CHONDROITIN PO Take by mouth. 1500 mg glucosamine and 1200 mg of choindroitan    . glucose blood (ONE TOUCH ULTRA TEST) test strip Test blood sugar twice daily 100 each 12  . Lancets (ONETOUCH ULTRASOFT) lancets Use as instructed 100 each 12  . metFORMIN (GLUCOPHAGE) 500 MG tablet Take 1 tablet (500 mg total) by mouth daily with breakfast. 90 tablet 0  . Multiple Vitamins-Minerals (MULTIVITAMIN WOMEN  50+) TABS Take 1 tablet daily by mouth.    . Omega-3 Fatty Acids (SUPER OMEGA 3 EPA/DHA PO) Take 660 mg by mouth daily.    . vitamin E 400 UNIT capsule Take 400 Units daily by mouth.     No current facility-administered medications on file prior to visit.     There were no vitals taken for this visit.      Objective:   Physical Exam Vitals signs and nursing note reviewed.  Constitutional:      General: She is not in acute distress.    Appearance: Normal appearance. She is well-developed and normal weight.  Neck:     Thyroid: No thyromegaly.     Trachea: No tracheal deviation.  Cardiovascular:     Rate and Rhythm: Normal rate and regular rhythm.     Heart sounds: Normal heart sounds. No murmur. No friction rub. No gallop.   Pulmonary:     Effort: Pulmonary effort is normal. No respiratory distress.     Breath sounds: Normal breath sounds. No wheezing or rales.  Lymphadenopathy:     Cervical: No cervical adenopathy.  Skin:    General: Skin is warm and dry.     Capillary Refill: Capillary refill takes less than 2 seconds.     Coloration: Skin is not pale.     Findings: No erythema or rash.  Neurological:     General: No focal deficit present.     Mental Status: She is alert and oriented to person, place, and time. Mental status is at baseline.     Cranial Nerves: No cranial nerve deficit.     Coordination: Coordination normal.  Psychiatric:        Mood and Affect: Mood normal.        Behavior: Behavior normal.        Thought Content: Thought content normal.        Judgment: Judgment normal.       Assessment & Plan:  1. Uncontrolled type 2 diabetes mellitus with hyperglycemia (HCC)  - POCT A1C- 5.4. Has improved.  - Will decrease Metformin to 250 mg daily.  - Follow up in 3 months  - metFORMIN (GLUCOPHAGE) 500 MG tablet; Take 0.5 tablets (250 mg total) by mouth daily with breakfast.  Dispense: 45 tablet;  Refill: 1

## 2018-07-16 ENCOUNTER — Ambulatory Visit
Admission: RE | Admit: 2018-07-16 | Discharge: 2018-07-16 | Disposition: A | Discharge status: Home / Self Care | Payer: Medicare Other | Source: Ambulatory Visit | Attending: Adult Health | Admitting: Adult Health

## 2018-07-16 DIAGNOSIS — Z1231 Encounter for screening mammogram for malignant neoplasm of breast: Secondary | ICD-10-CM

## 2018-09-21 ENCOUNTER — Ambulatory Visit (INDEPENDENT_AMBULATORY_CARE_PROVIDER_SITE_OTHER): Payer: Medicare Other | Admitting: Adult Health

## 2018-09-21 ENCOUNTER — Other Ambulatory Visit: Payer: Self-pay

## 2018-09-21 ENCOUNTER — Encounter: Payer: Self-pay | Admitting: Adult Health

## 2018-09-21 VITALS — BP 140/78 | Temp 98.2°F | Wt 108.0 lb

## 2018-09-21 DIAGNOSIS — E1165 Type 2 diabetes mellitus with hyperglycemia: Secondary | ICD-10-CM

## 2018-09-21 LAB — POCT GLYCOSYLATED HEMOGLOBIN (HGB A1C): HbA1c, POC (controlled diabetic range): 5.9 % (ref 0.0–7.0)

## 2018-09-21 NOTE — Addendum Note (Signed)
Addended by: Nancy Fetter on: 09/21/2018 09:18 AM   Modules accepted: Level of Service

## 2018-09-21 NOTE — Progress Notes (Signed)
Subjective:    Patient ID: Susan Schmidt, female    DOB: March 24, 1946, 73 y.o.   MRN: [id number]  HPI 73 year old female who  has a past medical history of DM (diabetes mellitus), type 2, uncontrolled ([place]), Hyperlipidemia, and Osteoporosis.   She presents to the office today for 71-monthfollow-up regarding diabetes.  During her last visit her A1c dropped from 5.9-5.4.  At this time metformin was decreased from 500 mg daily to 250 mg daily.  She does monitor her blood at her at home and reports readings between between 120-200. She is unsure if she is checking her blood sugars the right way   She denies any symptoms of hypoglycemia.  She continues to work on diet and has cut back on sugars and carbs   Review of Systems  Constitutional: Negative.   HENT: Negative.   Eyes: Negative.   Respiratory: Negative.   Cardiovascular: Negative.   Gastrointestinal: Negative.   Endocrine: Negative.   Genitourinary: Negative.   Musculoskeletal: Negative.   Skin: Negative.   Allergic/Immunologic: Negative.   Neurological: Negative.   Hematological: Negative.   Psychiatric/Behavioral: Negative.   All other systems reviewed and are negative.  Past Medical History:  Diagnosis Date  . DM (diabetes mellitus), type 2, [place]   . Hyperlipidemia   . Osteoporosis     Social History   Socioeconomic History  . Marital status: Single    Spouse name: Not on file  . Number of children: Not on file  . Years of education: Not on file  . Highest education level: Not on file  Occupational History  . Not on file  Social Needs  . Financial resource strain: Not on file  . Food insecurity:    Worry: Not on file    Inability: Not on file  . Transportation needs:    Medical: Not on file    Non-medical: Not on file  Tobacco Use  . Smoking status: Former Smoker    Packs/day: 2.00    Types: Cigarettes    Start date: 07/11/1964    Last attempt to quit: 07/11/1976    Years since  quitting: 42.2  . Smokeless tobacco: Never Used  Substance and Sexual Activity  . Alcohol use: No    Frequency: Never  . Drug use: Not on file  . Sexual activity: Not on file  Lifestyle  . Physical activity:    Days per week: Not on file    Minutes per session: Not on file  . Stress: Not on file  Relationships  . Social connections:    Talks on phone: Not on file    Gets together: Not on file    Attends religious service: Not on file    Active member of club or organization: Not on file    Attends meetings of clubs or organizations: Not on file    Relationship status: Not on file  . Intimate partner violence:    Fear of current or ex partner: Not on file    Emotionally abused: Not on file    Physically abused: Not on file    Forced sexual activity: Not on file  Other Topics Concern  . Not on file  Social History Narrative   Retired    Not married    No kids           Past Surgical History:  Procedure Laterality Date  . ANKLE SURGERY Right   . pilonidal cyst  Family History  Problem Relation Age of Onset  . Diabetes Mother   . Hypertension Mother   . Hypertension Father   . Heart attack Father   . Hypertension Sister   . Diabetes Brother   . Diabetes Sister     No Known Allergies  Current Outpatient Medications on File Prior to Visit  Medication Sig Dispense Refill  . aspirin EC 81 MG tablet Take 81 mg daily by mouth.    . Biotin 10000 MCG TABS Take 1 tablet daily by mouth.    . Blood Glucose Monitoring Suppl (ONE TOUCH ULTRA 2) w/Device KIT Test blood sugar twice daily 1 each 0  . Calcium Carbonate-Vit D-Min (CALCIUM-VITAMIN D-MINERALS) 600-800 MG-UNIT CHEW Chew 1 tablet daily by mouth.    . Cholecalciferol (VITAMIN D3) 1000 units CAPS Take 1 capsule by mouth daily.    Marland Kitchen GLUCOSAMINE-CHONDROITIN PO Take by mouth. 1500 mg glucosamine and 1200 mg of choindroitan    . glucose blood (ONE TOUCH ULTRA TEST) test strip Test blood sugar twice daily 100 each 12   . Lancets (ONETOUCH ULTRASOFT) lancets Use as instructed 100 each 12  . metFORMIN (GLUCOPHAGE) 500 MG tablet Take 0.5 tablets (250 mg total) by mouth daily with breakfast. 45 tablet 1  . Multiple Vitamins-Minerals (MULTIVITAMIN WOMEN 50+) TABS Take 1 tablet daily by mouth.    . Omega-3 Fatty Acids (SUPER OMEGA 3 EPA/DHA PO) Take 660 mg by mouth daily.    . vitamin E 400 UNIT capsule Take 400 Units daily by mouth.     No current facility-administered medications on file prior to visit.     BP 140/78   Temp 98.2 F (36.8 C)   Wt 108 lb (49 kg)   BMI 19.91 kg/m       Objective:   Physical Exam Cardiovascular:     Rate and Rhythm: Normal rate and regular rhythm.  Pulmonary:     Effort: Pulmonary effort is normal.     Breath sounds: Normal breath sounds.  Abdominal:     General: Abdomen is flat. Bowel sounds are normal.     Palpations: Abdomen is soft.  Skin:    General: Skin is warm and dry.  Neurological:     General: No focal deficit present.     Mental Status: She is oriented to person, place, and time.  Psychiatric:        Mood and Affect: Mood normal.        Behavior: Behavior normal.        Thought Content: Thought content normal.        Judgment: Judgment normal.       Assessment & Plan:  1. Uncontrolled type 2 diabetes mellitus with hyperglycemia (HCC)  - POCT A1C- 5.9  - Reviewed proper way in checking blood sugars  - Going to D/c Metformin  - Follow up in 3 months   [employer], AGNP

## 2019-01-03 ENCOUNTER — Other Ambulatory Visit: Payer: Self-pay | Admitting: Family Medicine

## 2019-01-03 ENCOUNTER — Other Ambulatory Visit (INDEPENDENT_AMBULATORY_CARE_PROVIDER_SITE_OTHER): Payer: Medicare Other

## 2019-01-03 ENCOUNTER — Other Ambulatory Visit: Payer: Self-pay

## 2019-01-03 DIAGNOSIS — E1165 Type 2 diabetes mellitus with hyperglycemia: Secondary | ICD-10-CM

## 2019-01-03 LAB — HEMOGLOBIN A1C: Hgb A1c MFr Bld: 5.9 % (ref 4.6–6.5)

## 2019-01-04 ENCOUNTER — Other Ambulatory Visit: Payer: Self-pay

## 2019-01-04 ENCOUNTER — Ambulatory Visit (INDEPENDENT_AMBULATORY_CARE_PROVIDER_SITE_OTHER): Payer: Medicare Other | Admitting: Adult Health

## 2019-01-04 ENCOUNTER — Encounter: Payer: Medicare Other | Admitting: Adult Health

## 2019-01-04 ENCOUNTER — Encounter: Payer: Self-pay | Admitting: Adult Health

## 2019-01-04 DIAGNOSIS — E118 Type 2 diabetes mellitus with unspecified complications: Secondary | ICD-10-CM | POA: Diagnosis not present

## 2019-01-04 DIAGNOSIS — E119 Type 2 diabetes mellitus without complications: Secondary | ICD-10-CM | POA: Insufficient documentation

## 2019-01-04 NOTE — Progress Notes (Signed)
Virtual Visit via Telephone Note  I connected with Susan Schmidt on 01/04/19 at  9:00 AM EDT by telephone and verified that I am speaking with the correct person using two identifiers.   I discussed the limitations, risks, security and privacy concerns of performing an evaluation and management service by telephone and the availability of in person appointments. I also discussed with the patient that there may be a patient responsible charge related to this service. The patient expressed understanding and agreed to proceed.  Location patient: home Location provider: work or home office Participants present for the call: patient, provider Patient did not have a visit in the prior 7 days to address this/these issue(s).   History of Present Illness: 73 year old female being evaluated today for 44-month follow-up regarding diabetes.  In March 2020 her A1c was 5.9's time she was on metformin 250 mg daily and the decision was made to take her off this medication completely.  He has been monitoring her blood sugars at home twice a day and reports blood sugar levels consistently in the 120s to 130s, but will have some elevated blood sugars depending on what she eats.  She is watching what she eats and is staying active with exercise and working in the yard.  Today her A1c is 5.9   Observations/Objective: Patient sounds cheerful and well on the phone. I do not appreciate any SOB. Speech and thought processing are grossly intact. Patient reported vitals:  Assessment and Plan: 1. Controlled type 2 diabetes mellitus with complication, without long-term current use of insulin (HCC) -A1C has stayed the same over the last 3 months.  Okay to take continue to not take metformin.  Encouraged continued lifestyle modifications.  We will recheck in September at her physical exam  Follow Up Instructions:  I did not refer this patient for an OV in the next 24 hours for this/these issue(s).  I  discussed the assessment and treatment plan with the patient. The patient was provided an opportunity to ask questions and all were answered. The patient agreed with the plan and demonstrated an understanding of the instructions.   The patient was advised to call back or seek an in-person evaluation if the symptoms worsen or if the condition fails to improve as anticipated.  I provided 20 minutes of non-face-to-face time during this encounter.   Dorothyann Peng, NP

## 2019-03-14 ENCOUNTER — Ambulatory Visit (INDEPENDENT_AMBULATORY_CARE_PROVIDER_SITE_OTHER): Payer: Medicare Other | Admitting: Adult Health

## 2019-03-14 ENCOUNTER — Encounter: Payer: Self-pay | Admitting: Adult Health

## 2019-03-14 VITALS — BP 142/80 | Temp 98.9°F | Ht 62.0 in | Wt 107.0 lb

## 2019-03-14 DIAGNOSIS — E118 Type 2 diabetes mellitus with unspecified complications: Secondary | ICD-10-CM

## 2019-03-14 DIAGNOSIS — E782 Mixed hyperlipidemia: Secondary | ICD-10-CM | POA: Diagnosis not present

## 2019-03-14 DIAGNOSIS — Z0000 Encounter for general adult medical examination without abnormal findings: Secondary | ICD-10-CM

## 2019-03-14 DIAGNOSIS — Z23 Encounter for immunization: Secondary | ICD-10-CM

## 2019-03-14 DIAGNOSIS — M810 Age-related osteoporosis without current pathological fracture: Secondary | ICD-10-CM | POA: Diagnosis not present

## 2019-03-14 DIAGNOSIS — Z1211 Encounter for screening for malignant neoplasm of colon: Secondary | ICD-10-CM

## 2019-03-14 LAB — CBC WITH DIFFERENTIAL/PLATELET
Basophils Absolute: 0 10*3/uL (ref 0.0–0.1)
Basophils Relative: 0.6 % (ref 0.0–3.0)
Eosinophils Absolute: 0 10*3/uL (ref 0.0–0.7)
Eosinophils Relative: 0.5 % (ref 0.0–5.0)
HCT: 40.6 % (ref 36.0–46.0)
Hemoglobin: 14.1 g/dL (ref 12.0–15.0)
Lymphocytes Relative: 33.1 % (ref 12.0–46.0)
Lymphs Abs: 1.9 10*3/uL (ref 0.7–4.0)
MCHC: 34.6 g/dL (ref 30.0–36.0)
MCV: 92.6 fl (ref 78.0–100.0)
Monocytes Absolute: 0.4 10*3/uL (ref 0.1–1.0)
Monocytes Relative: 6.3 % (ref 3.0–12.0)
Neutro Abs: 3.4 10*3/uL (ref 1.4–7.7)
Neutrophils Relative %: 59.5 % (ref 43.0–77.0)
Platelets: 175 10*3/uL (ref 150.0–400.0)
RBC: 4.39 Mil/uL (ref 3.87–5.11)
RDW: 12.8 % (ref 11.5–15.5)
WBC: 5.7 10*3/uL (ref 4.0–10.5)

## 2019-03-14 LAB — COMPREHENSIVE METABOLIC PANEL
ALT: 15 U/L (ref 0–35)
AST: 18 U/L (ref 0–37)
Albumin: 4.2 g/dL (ref 3.5–5.2)
Alkaline Phosphatase: 52 U/L (ref 39–117)
BUN: 9 mg/dL (ref 6–23)
CO2: 30 mEq/L (ref 19–32)
Calcium: 9.6 mg/dL (ref 8.4–10.5)
Chloride: 104 mEq/L (ref 96–112)
Creatinine, Ser: 0.62 mg/dL (ref 0.40–1.20)
GFR: 93.94 mL/min (ref >60.00)
Glucose, Bld: 116 mg/dL — ABNORMAL HIGH (ref 70–99)
Potassium: 4.6 mEq/L (ref 3.5–5.1)
Sodium: 140 mEq/L (ref 135–145)
Total Bilirubin: 0.5 mg/dL (ref 0.2–1.2)
Total Protein: 7 g/dL (ref 6.0–8.3)

## 2019-03-14 LAB — LIPID PANEL
Cholesterol: 176 mg/dL (ref 0–200)
HDL: 65.1 mg/dL (ref >39.00)
LDL Cholesterol: 97 mg/dL (ref 0–99)
NonHDL: 110.66
Total CHOL/HDL Ratio: 3
Triglycerides: 66 mg/dL (ref 0.0–149.0)
VLDL: 13.2 mg/dL (ref 0.0–40.0)

## 2019-03-14 LAB — TSH: TSH: 2.77 u[IU]/mL (ref 0.35–4.50)

## 2019-03-14 LAB — HEMOGLOBIN A1C: Hgb A1c MFr Bld: 5.9 % (ref 4.6–6.5)

## 2019-03-14 LAB — VITAMIN D 25 HYDROXY (VIT D DEFICIENCY, FRACTURES): VITD: 51.39 ng/mL (ref 30.00–100.00)

## 2019-03-14 MED ORDER — GLUCOSE BLOOD VI STRP: ORAL_STRIP | 12 refills | Status: DC

## 2019-03-14 MED ORDER — ONETOUCH ULTRASOFT LANCETS MISC: 12 refills | Status: DC

## 2019-03-14 MED ORDER — ONETOUCH ULTRA 2 W/DEVICE KIT: PACK | 0 refills | Status: DC

## 2019-03-14 NOTE — Addendum Note (Signed)
Addended by: Modena Morrow R on: 03/14/2019 09:33 AM   Modules accepted: Orders

## 2019-03-14 NOTE — Progress Notes (Signed)
Subjective:    Patient ID: Susan Schmidt, female    DOB: 01-Mar-1946, 73 y.o.   MRN: [id number]  HPI Patient presents for yearly preventative medicine examination. She is a pleasant 73 year old female who  has a past medical history of DM (diabetes mellitus), type 2, uncontrolled ([place]), Hyperlipidemia, and Osteoporosis.  DM -currently diet controlled.  In March 2020 her A1c was 5.9 and at that time she was on metformin 250 mg daily.  The decision was made to take her off this medication completely.  Follow-up her A1c was 5.9 in June 2020.  She has been monitoring her blood sugars at home twice a day and reports blood sugar levels consistently in the 120s to 130s, but will have some elevated blood sugars depending on what she eats.  She is trying to eat a heart healthy diet and is staying active with exercise and working in the yard.  Osteoporosis -last DEXA scan was in 06/2017.  She has declined treatment with Prolia in the past due to side effects of the medication.  Currently she is taking vitamin D 1000 units daily and calcium supplement.  Hyperlipidemia -has history of mildly elevated LDL.  Not currently on medication.  All immunizations and health maintenance protocols were reviewed with the patient and needed orders were placed. Due for influenza vaccination.   Appropriate screening laboratory values were ordered for the patient including screening of hyperlipidemia, renal function and hepatic function.  Medication reconciliation,  past medical history, social history, problem list and allergies were reviewed in detail with the patient  Goals were established with regard to weight loss, exercise, and  diet in compliance with medications  End of life planning was discussed.  She is up to date on routine mammograms. She is due for a colonoscopy and vision screen.   She has no acute issues   Review of Systems  Constitutional: Negative.   HENT: Negative.   Eyes: Negative.    Respiratory: Negative.   Cardiovascular: Negative.   Gastrointestinal: Negative.   Endocrine: Negative.   Genitourinary: Negative.   Musculoskeletal: Negative.   Skin: Negative.   Allergic/Immunologic: Negative.   Neurological: Negative.   Hematological: Negative.   Psychiatric/Behavioral: Negative.      Past Medical History:  Diagnosis Date  . DM (diabetes mellitus), type 2, uncontrolled ([employer])   . Hyperlipidemia   . Osteoporosis     Social History   Socioeconomic History  . Marital status: Single    Spouse name: Not on file  . Number of children: Not on file  . Years of education: Not on file  . Highest education level: Not on file  Occupational History  . Not on file  Social Needs  . Financial resource strain: Not on file  . Food insecurity    Worry: Not on file    Inability: Not on file  . Transportation needs    Medical: Not on file    Non-medical: Not on file  Tobacco Use  . Smoking status: Former Smoker    Packs/day: 2.00    Types: Cigarettes    Start date: 07/11/1964    Quit date: 07/11/1976    Years since quitting: 42.7  . Smokeless tobacco: Never Used  Substance and Sexual Activity  . Alcohol use: No    Frequency: Never  . Drug use: Not on file  . Sexual activity: Not on file  Lifestyle  . Physical activity    Days per week: Not on file  Minutes per session: Not on file  . Stress: Not on file  Relationships  . Social Herbalist on phone: Not on file    Gets together: Not on file    Attends religious service: Not on file    Active member of club or organization: Not on file    Attends meetings of clubs or organizations: Not on file    Relationship status: Not on file  . Intimate partner violence    Fear of current or ex partner: Not on file    Emotionally abused: Not on file    Physically abused: Not on file    Forced sexual activity: Not on file  Other Topics Concern  . Not on file  Social History Narrative   Retired    Not  married    No kids           Past Surgical History:  Procedure Laterality Date  . ANKLE SURGERY Right   . pilonidal cyst      Family History  Problem Relation Age of Onset  . Diabetes Mother   . Hypertension Mother   . Hypertension Father   . Heart attack Father   . Hypertension Sister   . Diabetes Brother   . Diabetes Sister     No Known Allergies  Current Outpatient Medications on File Prior to Visit  Medication Sig Dispense Refill  . aspirin EC 81 MG tablet Take 81 mg daily by mouth.    . Biotin 10000 MCG TABS Take 1 tablet daily by mouth.    . Blood Glucose Monitoring Suppl (ONE TOUCH ULTRA 2) w/Device KIT Test blood sugar twice daily 1 each 0  . Calcium Carbonate-Vit D-Min (CALCIUM-VITAMIN D-MINERALS) 600-800 MG-UNIT CHEW Chew 1 tablet daily by mouth.    . Cholecalciferol (VITAMIN D3) 1000 units CAPS Take 1 capsule by mouth daily.    Marland Kitchen GLUCOSAMINE-CHONDROITIN PO Take by mouth. 1500 mg glucosamine and 1200 mg of choindroitan    . glucose blood (ONE TOUCH ULTRA TEST) test strip Test blood sugar twice daily 100 each 12  . Lancets (ONETOUCH ULTRASOFT) lancets Use as instructed 100 each 12  . Multiple Vitamins-Minerals (MULTIVITAMIN WOMEN 50+) TABS Take 1 tablet daily by mouth.    . Omega-3 Fatty Acids (SUPER OMEGA 3 EPA/DHA PO) Take 660 mg by mouth daily.    . vitamin E 400 UNIT capsule Take 400 Units daily by mouth.     No current facility-administered medications on file prior to visit.     There were no vitals taken for this visit.      Objective:   Physical Exam Vitals signs and nursing note reviewed.  Constitutional:      Appearance: Normal appearance.  HENT:     Mouth/Throat:     Mouth: Mucous membranes are moist.     Pharynx: Oropharynx is clear.  Eyes:     Extraocular Movements: Extraocular movements intact.     Pupils: Pupils are equal, round, and reactive to light.  Cardiovascular:     Rate and Rhythm: Normal rate and regular rhythm.     Pulses:  Normal pulses.     Heart sounds: Normal heart sounds.  Pulmonary:     Effort: Pulmonary effort is normal.     Breath sounds: Normal breath sounds.  Abdominal:     General: Abdomen is flat.     Palpations: Abdomen is soft.  Musculoskeletal: Normal range of motion.  Skin:    General: Skin is  warm and dry.     Capillary Refill: Capillary refill takes less than 2 seconds.  Neurological:     General: No focal deficit present.     Mental Status: She is alert and oriented to person, place, and time.  Psychiatric:        Mood and Affect: Mood normal.        Behavior: Behavior normal.        Thought Content: Thought content normal.        Judgment: Judgment normal.       Assessment & Plan:  1. Routine general medical examination at a health care facility - Continue to stay active and eat healthy  - Follow up in one year or sooner if needed - CBC with Differential/Platelet - Comprehensive metabolic panel - Hemoglobin A1c - Lipid panel - TSH  2. Controlled type 2 diabetes mellitus with complication, without long-term current use of insulin (HCC) - Consider adding metformin  - CBC with Differential/Platelet - Comprehensive metabolic panel - Hemoglobin A1c - Lipid panel - TSH - Blood Glucose Monitoring Suppl (ONE TOUCH ULTRA 2) w/Device KIT; Test blood sugar twice daily  Dispense: 1 kit; Refill: 0 - Lancets (ONETOUCH ULTRASOFT) lancets; Use as instructed  Dispense: 100 each; Refill: 12 - glucose blood (ONE TOUCH ULTRA TEST) test strip; Test blood sugar twice daily  Dispense: 100 each; Refill: 12  3. Mixed hyperlipidemia - Consider statin  - CBC with Differential/Platelet - Comprehensive metabolic panel - Hemoglobin A1c - Lipid panel - TSH  4. Age-related osteoporosis without current pathological fracture - Bone Density screen in December  - Vitamin D, 25-hydroxy   5. Colon cancer screening  - Ambulatory referral to Gastroenterology   , NP  

## 2019-03-20 ENCOUNTER — Encounter (INDEPENDENT_AMBULATORY_CARE_PROVIDER_SITE_OTHER): Payer: Self-pay | Admitting: *Deleted

## 2019-04-05 ENCOUNTER — Other Ambulatory Visit (INDEPENDENT_AMBULATORY_CARE_PROVIDER_SITE_OTHER): Payer: Self-pay | Admitting: *Deleted

## 2019-04-05 DIAGNOSIS — Z1211 Encounter for screening for malignant neoplasm of colon: Secondary | ICD-10-CM | POA: Insufficient documentation

## 2019-05-14 ENCOUNTER — Other Ambulatory Visit (INDEPENDENT_AMBULATORY_CARE_PROVIDER_SITE_OTHER): Payer: Self-pay | Admitting: *Deleted

## 2019-05-14 ENCOUNTER — Encounter (INDEPENDENT_AMBULATORY_CARE_PROVIDER_SITE_OTHER): Payer: Self-pay | Admitting: *Deleted

## 2019-05-14 ENCOUNTER — Telehealth (INDEPENDENT_AMBULATORY_CARE_PROVIDER_SITE_OTHER): Payer: Self-pay | Admitting: *Deleted

## 2019-05-14 DIAGNOSIS — Z1211 Encounter for screening for malignant neoplasm of colon: Secondary | ICD-10-CM

## 2019-05-14 MED ORDER — SUPREP BOWEL PREP KIT 17.5-3.13-1.6 GM/177ML PO SOLN: 1.0000 | Freq: Once | ORAL | 0 refills | Status: AC

## 2019-05-14 NOTE — Telephone Encounter (Signed)
Patient needs suprep TCS sch'd 12/23 

## 2019-06-03 ENCOUNTER — Ambulatory Visit (INDEPENDENT_AMBULATORY_CARE_PROVIDER_SITE_OTHER): Payer: Medicare Other

## 2019-06-03 ENCOUNTER — Other Ambulatory Visit (INDEPENDENT_AMBULATORY_CARE_PROVIDER_SITE_OTHER): Payer: Self-pay | Admitting: *Deleted

## 2019-06-03 ENCOUNTER — Telehealth (INDEPENDENT_AMBULATORY_CARE_PROVIDER_SITE_OTHER): Payer: Self-pay | Admitting: *Deleted

## 2019-06-03 NOTE — Telephone Encounter (Signed)
Susan Schmidt, I have reviewed the patient's last office visit with Primary NP. Ok to proceed with a colonoscopy. Pt to hold Aspirin morning of procedure. PCP's last note mentioned possibly adding metformin. If pt is on metformin pt to hold this medication evening before and am of procedure thx

## 2019-06-03 NOTE — Telephone Encounter (Signed)
Referring MD/PCP: [email or web address]    Procedure: tcs  Reason/Indication:  screening  Has patient had this procedure before?  no  If so, when, by whom and where?    Is there a family history of colon cancer?  no  Who?  What age when diagnosed?    Is patient diabetic?   yes      Does patient have prosthetic heart valve or mechanical valve?  no  Do you have a pacemaker/defibrillator?  no  Has patient ever had endocarditis/atrial fibrillation? no  Does patient use oxygen? no  Has patient had joint replacement within last 12 months?  no  Is patient constipated or do they take laxatives? no  Does patient have a history of alcohol/drug use?  no  Is patient on blood thinner such as Coumadin, Plavix and/or Aspirin? yes  Medications: calcium 600 mg w Vit D3 bid, Vit E daily, biotin daily, asa 81 mg daily, fish oil daily, mvi daily, glucosamine daily  Allergies: nkda  Medication Adjustment per Dr Laural Golden: asa 2 days, vit e 10 days  Procedure date & time: 07/03/19 at 830

## 2019-06-10 ENCOUNTER — Other Ambulatory Visit: Payer: Self-pay | Admitting: Adult Health

## 2019-06-10 DIAGNOSIS — Z1231 Encounter for screening mammogram for malignant neoplasm of breast: Secondary | ICD-10-CM

## 2019-06-17 NOTE — Telephone Encounter (Signed)
Colonoscopy with conscious sedation 

## 2019-07-01 ENCOUNTER — Other Ambulatory Visit (HOSPITAL_COMMUNITY)
Admission: RE | Admit: 2019-07-01 | Discharge: 2019-07-01 | Disposition: A | Discharge status: Home / Self Care | Payer: Medicare Other | Source: Ambulatory Visit | Attending: Internal Medicine | Admitting: Internal Medicine

## 2019-07-01 ENCOUNTER — Other Ambulatory Visit: Payer: Self-pay

## 2019-07-01 DIAGNOSIS — Z20828 Contact with and (suspected) exposure to other viral communicable diseases: Secondary | ICD-10-CM | POA: Insufficient documentation

## 2019-07-01 DIAGNOSIS — Z01812 Encounter for preprocedural laboratory examination: Secondary | ICD-10-CM | POA: Diagnosis present

## 2019-07-01 LAB — SARS CORONAVIRUS 2 (TAT 6-24 HRS): SARS Coronavirus 2: NEGATIVE

## 2019-07-03 ENCOUNTER — Ambulatory Visit (HOSPITAL_COMMUNITY)
Admission: RE | Admit: 2019-07-03 | Discharge: 2019-07-03 | Disposition: A | Discharge status: Home / Self Care | Payer: Medicare Other | Attending: Internal Medicine | Admitting: Internal Medicine

## 2019-07-03 ENCOUNTER — Encounter (HOSPITAL_COMMUNITY)
Admission: RE | Disposition: A | Discharge status: Home / Self Care | Payer: Self-pay | Source: Home / Self Care | Attending: Internal Medicine

## 2019-07-03 ENCOUNTER — Other Ambulatory Visit: Payer: Self-pay

## 2019-07-03 ENCOUNTER — Encounter (HOSPITAL_COMMUNITY): Payer: Self-pay | Admitting: Internal Medicine

## 2019-07-03 DIAGNOSIS — Z87891 Personal history of nicotine dependence: Secondary | ICD-10-CM | POA: Diagnosis not present

## 2019-07-03 DIAGNOSIS — K644 Residual hemorrhoidal skin tags: Secondary | ICD-10-CM | POA: Insufficient documentation

## 2019-07-03 DIAGNOSIS — E119 Type 2 diabetes mellitus without complications: Secondary | ICD-10-CM | POA: Insufficient documentation

## 2019-07-03 DIAGNOSIS — K5730 Diverticulosis of large intestine without perforation or abscess without bleeding: Secondary | ICD-10-CM | POA: Diagnosis not present

## 2019-07-03 DIAGNOSIS — Z1211 Encounter for screening for malignant neoplasm of colon: Secondary | ICD-10-CM | POA: Insufficient documentation

## 2019-07-03 DIAGNOSIS — Z7982 Long term (current) use of aspirin: Secondary | ICD-10-CM | POA: Insufficient documentation

## 2019-07-03 HISTORY — PX: COLONOSCOPY: SHX5424

## 2019-07-03 LAB — GLUCOSE, CAPILLARY: Glucose-Capillary: 114 mg/dL — ABNORMAL HIGH (ref 70–99)

## 2019-07-03 SURGERY — COLONOSCOPY
Anesthesia: Moderate Sedation

## 2019-07-03 MED ORDER — MEPERIDINE HCL 50 MG/ML IJ SOLN
INTRAMUSCULAR | Status: AC
  Filled 2019-07-03: qty 1

## 2019-07-03 MED ORDER — MIDAZOLAM HCL 5 MG/5ML IJ SOLN
INTRAMUSCULAR | Status: DC | PRN
  Administered 2019-07-03: 2 mg via INTRAVENOUS
  Administered 2019-07-03: 1 mg via INTRAVENOUS

## 2019-07-03 MED ORDER — SODIUM CHLORIDE 0.9 % IV SOLN: INTRAVENOUS | Status: DC

## 2019-07-03 MED ORDER — STERILE WATER FOR IRRIGATION IR SOLN
Status: DC | PRN
  Administered 2019-07-03: 08:00:00 1.5 mL

## 2019-07-03 MED ORDER — MEPERIDINE HCL 50 MG/ML IJ SOLN
INTRAMUSCULAR | Status: DC | PRN
  Administered 2019-07-03 (×2): 25 mg via INTRAVENOUS

## 2019-07-03 MED ORDER — MIDAZOLAM HCL 5 MG/5ML IJ SOLN
INTRAMUSCULAR | Status: AC
  Filled 2019-07-03: qty 10

## 2019-07-03 NOTE — Discharge Instructions (Signed)
Resume usual medications as before. Resume usual diet. No driving for 24 hours. No more screening colonoscopies.      Colonoscopy, Adult, Care After This sheet gives you information about how to care for yourself after your procedure. Your doctor may also give you more specific instructions. If you have problems or questions, call your doctor. What can I expect after the procedure? After the procedure, it is common to have:  A small amount of blood in your poop for 24 hours.  Some gas.  Mild cramping or bloating in your belly. Follow these instructions at home: General instructions  For the first 24 hours after the procedure: ? Do not drive or use machinery. ? Do not sign important documents. ? Do not drink alcohol. ? Do your daily activities more slowly than normal. ? Eat foods that are soft and easy to digest.  Take over-the-counter or prescription medicines only as told by your doctor. To help cramping and bloating:   Try walking around.  Put heat on your belly (abdomen) as told by your doctor. Use a heat source that your doctor recommends, such as a moist heat pack or a heating pad. ? Put a towel between your skin and the heat source. ? Leave the heat on for 20-30 minutes. ? Remove the heat if your skin turns bright red. This is especially important if you cannot feel pain, heat, or cold. You can get burned. Eating and drinking   Drink enough fluid to keep your pee (urine) clear or pale yellow.  Return to your normal diet as told by your doctor. Avoid heavy or fried foods that are hard to digest.  Avoid drinking alcohol for as long as told by your doctor. Contact a doctor if:  You have blood in your poop (stool) 2-3 days after the procedure. Get help right away if:  You have more than a small amount of blood in your poop.  You see large clumps of tissue (blood clots) in your poop.  Your belly is swollen.  You feel sick to your stomach (nauseous).  You  throw up (vomit).  You have a fever.  You have belly pain that gets worse, and medicine does not help your pain. Summary  After the procedure, it is common to have a small amount of blood in your poop. You may also have mild cramping and bloating in your belly.  For the first 24 hours after the procedure, do not drive or use machinery, do not sign important documents, and do not drink alcohol.  Get help right away if you have a lot of blood in your poop, feel sick to your stomach, have a fever, or have more belly pain. This information is not intended to replace advice given to you by your health care provider. Make sure you discuss any questions you have with your health care provider. Document Released: 07/30/2010 Document Revised: 04/27/2017 Document Reviewed: 03/21/2016 Elsevier Patient Education  2020 [employer].

## 2019-07-03 NOTE — Op Note (Signed)
[facility] Patient Name: Susan Schmidt Procedure Date: 07/03/2019 8:06 AM MRN: [id number] Date of Birth: [birth year] Attending MD: Lionel December , MD CSN: [id number] Age: 73 Admit Type: Outpatient Procedure:                Colonoscopy Indications:              Screening for colorectal malignant neoplasm Providers:                Lionel December, MD, Loma Messing B. Patsy Lager, RN, Pandora Leiter, Technician Referring MD:             Shirline Frees, NP Medicines:                Meperidine 50 mg IV, Midazolam 3 mg IV Complications:            No immediate complications. Estimated Blood Loss:     Estimated blood loss: none. Procedure:                Pre-Anesthesia Assessment:                           - Prior to the procedure, a History and Physical                            was performed, and patient medications and                            allergies were reviewed. The patient's tolerance of                            previous anesthesia was also reviewed. The risks                            and benefits of the procedure and the sedation                            options and risks were discussed with the patient.                            All questions were answered, and informed consent                            was obtained. Prior Anticoagulants: The patient has                            taken no previous anticoagulant or antiplatelet                            agents except for aspirin. ASA Grade Assessment: II                            - A patient with mild systemic disease. After  reviewing the risks and benefits, the patient was                            deemed in satisfactory condition to undergo the                            procedure.                           After obtaining informed consent, the colonoscope                            was passed under direct vision. Throughout the   procedure, the patient's blood pressure, pulse, and                            oxygen saturations were monitored continuously. The                            [id number]-[id number] ([id number] scope was introduced through                            the anus and advanced to the the cecum, identified                            by appendiceal orifice and ileocecal valve. The                            colonoscopy was performed without difficulty. The                            patient tolerated the procedure well. The quality                            of the bowel preparation was excellent. The                            ileocecal valve, appendiceal orifice, and rectum                            were photographed. Scope In: 8:33:50 AM Scope Out: 8:50:35 AM Scope Withdrawal Time: 0 hours 7 minutes 48 seconds  Total Procedure Duration: 0 hours 16 minutes 45 seconds  Findings:      Skin tags were found on perianal exam.      The digital rectal exam was normal.      A single diverticulum was found in the splenic flexure.      The exam was otherwise normal throughout the examined colon.      The retroflexed view of the distal rectum and anal verge was normal and       showed no anal or rectal abnormalities. Impression:               - Perianal skin tags found on perianal exam.                           -  Diverticulosis at the splenic flexure.                           - No specimens collected. Moderate Sedation:      Moderate (conscious) sedation was administered by the endoscopy nurse       and supervised by the endoscopist. The following parameters were       monitored: oxygen saturation, heart rate, blood pressure, CO2       capnography and response to care. Total physician intraservice time was       22 minutes. Recommendation:           - Patient has a contact number available for                            emergencies. The signs and symptoms of potential                            delayed  complications were discussed with the                            patient. Return to normal activities tomorrow.                            Written discharge instructions were provided to the                            patient.                           - Resume previous diet today.                           - Continue present medications.                           - No repeat colonoscopy due to age and the absence                            of advanced adenomas. Procedure Code(s):        --- Professional ---                           [phone], Colonoscopy, flexible; diagnostic, including                            collection of specimen(s) by brushing or washing,                            when performed (separate procedure)                           G0500, Moderate sedation services provided by the                            same physician or other qualified health care  professional performing a gastrointestinal                            endoscopic service that sedation supports,                            requiring the presence of an independent trained                            observer to assist in the monitoring of the                            patient's level of consciousness and physiological                            status; initial 15 minutes of intra-service time;                            patient age 41 years or older (additional time may                            be reported with [phone], as appropriate) Diagnosis Code(s):        --- Professional ---                           Z12.11, Encounter for screening for malignant                            neoplasm of colon                           K64.4, Residual hemorrhoidal skin tags                           K57.30, Diverticulosis of large intestine without                            perforation or abscess without bleeding CPT copyright 2019 American Medical Association. All rights reserved. The codes  documented in this report are preliminary and upon coder review may  be revised to meet current compliance requirements. Hildred Laser, MD Hildred Laser, MD 07/03/2019 8:57:32 AM This report has been signed electronically. Number of Addenda: 0

## 2019-07-03 NOTE — H&P (Signed)
Susan Schmidt is an 73 y.o. female.   Chief Complaint: Patient is here for colonoscopy. HPI: Patient is 73 year old Caucasian female who is here for screening colonoscopy.  This is patient's first exam.  She denies abdominal pain change in bowel habits or rectal bleeding.  She states she used to weigh 180 pounds but she lost 7 pounds when she was diagnosed with diabetes.  She is now down 205 pounds and she is maintaining it. Last aspirin dose was 4 days ago. Family history is negative for CRC.  Past Medical History:  Diagnosis Date  . DM (diabetes mellitus), type 2, uncontrolled ([place])   . Hyperlipidemia   . Osteoporosis     Past Surgical History:  Procedure Laterality Date  . ANKLE SURGERY Right   . pilonidal cyst      Family History  Problem Relation Age of Onset  . Diabetes Mother   . Hypertension Mother   . Hypertension Father   . Heart attack Father   . Hypertension Sister   . Diabetes Brother   . Diabetes Sister    Social History:  reports that she quit smoking about 43 years ago. Her smoking use included cigarettes. She started smoking about 55 years ago. She smoked 2.00 packs per day. She has never used smokeless tobacco. She reports that she does not drink alcohol or use drugs.  Allergies: No Known Allergies  Medications Prior to Admission  Medication Sig Dispense Refill  . aspirin EC 81 MG tablet Take 81 mg daily by mouth.    . Biotin 10000 MCG TABS Take 1 tablet daily by mouth.    . Calcium Carbonate-Vit D-Min (CALCIUM-VITAMIN D-MINERALS) 600-800 MG-UNIT CHEW Chew 1 tablet daily by mouth.    . Cholecalciferol (VITAMIN D) 50 MCG (2000 UT) tablet Take 2,000 Units by mouth daily.    Marland Kitchen GLUCOSAMINE-CHONDROITIN PO Take 1 tablet by mouth daily. 1500 mg glucosamine and 1200 mg of choindroitan     . Multiple Vitamins-Minerals (MULTIVITAMIN WOMEN 50+) TABS Take 1 tablet daily by mouth.    . Omega-3 Fatty Acids (SUPER OMEGA 3 EPA/DHA PO) Take 660 mg by mouth daily.     . vitamin E 400 UNIT capsule Take 400 Units daily by mouth.    . Blood Glucose Monitoring Suppl (ONE TOUCH ULTRA 2) w/Device KIT Test blood sugar twice daily 1 kit 0  . glucose blood (ONE TOUCH ULTRA TEST) test strip Test blood sugar twice daily 100 each 12  . Lancets (ONETOUCH ULTRASOFT) lancets Use as instructed 100 each 12    Results for orders placed or performed during the hospital encounter of 07/03/19 (from the past 48 hour(s))  Glucose, capillary     Status: Abnormal   Collection Time: 07/03/19  7:40 AM  Result Value Ref Range   Glucose-Capillary 114 (H) 70 - 99 mg/dL   No results found.  Review of Systems  Blood pressure (!) 169/69, pulse 87, temperature 99 F (37.2 C), temperature source Oral, resp. rate 17, height _0  (1.6 m), weight 47.6 kg, SpO2 100 %. Physical Exam  Constitutional:  Well-developed thin Caucasian female in NAD.  HENT:  Mouth/Throat: Oropharynx is clear and moist.  Eyes: Conjunctivae are normal. No scleral icterus.  Neck: No thyromegaly present.  Cardiovascular: Normal rate, regular rhythm and normal heart sounds.  No murmur heard. Respiratory: Effort normal and breath sounds normal.  GI:  Abdomen is flat but soft and nontender with organomegaly or masses.  Musculoskeletal:  General: No edema.  Lymphadenopathy:    She has no cervical adenopathy.  Neurological: She is alert.  Skin: Skin is warm and dry.     Assessment/Plan Average risk screening colonoscopy.  Hildred Laser, MD 07/03/2019, 8:25 AM

## 2019-07-18 ENCOUNTER — Other Ambulatory Visit: Payer: Self-pay

## 2019-07-18 ENCOUNTER — Ambulatory Visit
Admission: RE | Admit: 2019-07-18 | Discharge: 2019-07-18 | Disposition: A | Discharge status: Home / Self Care | Payer: Medicare Other | Source: Ambulatory Visit | Attending: Adult Health | Admitting: Adult Health

## 2019-07-18 DIAGNOSIS — Z1231 Encounter for screening mammogram for malignant neoplasm of breast: Secondary | ICD-10-CM

## 2019-09-12 ENCOUNTER — Other Ambulatory Visit: Payer: Self-pay

## 2019-09-13 ENCOUNTER — Ambulatory Visit (INDEPENDENT_AMBULATORY_CARE_PROVIDER_SITE_OTHER): Payer: Medicare Other | Admitting: Adult Health

## 2019-09-13 ENCOUNTER — Encounter: Payer: Self-pay | Admitting: Adult Health

## 2019-09-13 VITALS — BP 140/70 | Temp 97.9°F | Wt 110.0 lb

## 2019-09-13 DIAGNOSIS — E118 Type 2 diabetes mellitus with unspecified complications: Secondary | ICD-10-CM | POA: Diagnosis not present

## 2019-09-13 LAB — POCT GLYCOSYLATED HEMOGLOBIN (HGB A1C): HbA1c, POC (controlled diabetic range): 5.6 % (ref 0.0–7.0)

## 2019-09-13 NOTE — Progress Notes (Signed)
Subjective:    Patient ID: Susan Schmidt, female    DOB: Dec 11, 1945, 74 y.o.   MRN: [id number]  HPI  Pleasant 74 year old female who  has a past medical history of DM (diabetes mellitus), type 2, uncontrolled ([place]), Hyperlipidemia, and Osteoporosis.  She presents to the office today for 70-monthfollow-up regarding diabetes mellitus.  She is currently diet controlled.  In March 2020 her A1c was 5.9 and at that time she was on metformin 250 mg daily.  The decision was made to take her off this medication completely.  On follow-up in June 2020 her A1c was 5.9.  She has been monitoring her blood sugars at home twice a day reports blood sugar readings consistently in the low 120s to 130s.  She continues to eat a heart healthy diet and is staying active with exercise and working in the yard   Review of Systems See HPI   Past Medical History:  Diagnosis Date  . DM (diabetes mellitus), type 2, uncontrolled [place]   . Hyperlipidemia   . Osteoporosis     Social History   Socioeconomic History  . Marital status: Single    Spouse name: Not on file  . Number of children: Not on file  . Years of education: Not on file  . Highest education level: Not on file  Occupational History  . Not on file  Tobacco Use  . Smoking status: Former Smoker    Packs/day: 2.00    Types: Cigarettes    Start date: 07/11/1964    Quit date: 07/11/1976    Years since quitting: 43.2  . Smokeless tobacco: Never Used  Substance and Sexual Activity  . Alcohol use: No  . Drug use: Never  . Sexual activity: Not on file  Other Topics Concern  . Not on file  Social History Narrative   Retired    Not married    No kids          Social Determinants of Health   Financial Resource Strain:   . Difficulty of Paying Living Expenses: Not on file  Food Insecurity:   . Worried About RCharity fundraiserin the Last Year: Not on file  . Ran Out of Food in the Last Year: Not on file  Transportation Needs:   .  Lack of Transportation (Medical): Not on file  . Lack of Transportation (Non-Medical): Not on file  Physical Activity:   . Days of Exercise per Week: Not on file  . Minutes of Exercise per Session: Not on file  Stress:   . Feeling of Stress : Not on file  Social Connections:   . Frequency of Communication with Friends and Family: Not on file  . Frequency of Social Gatherings with Friends and Family: Not on file  . Attends Religious Services: Not on file  . Active Member of Clubs or Organizations: Not on file  . Attends CArchivistMeetings: Not on file  . Marital Status: Not on file  Intimate Partner Violence:   . Fear of Current or Ex-Partner: Not on file  . Emotionally Abused: Not on file  . Physically Abused: Not on file  . Sexually Abused: Not on file    Past Surgical History:  Procedure Laterality Date  . ANKLE SURGERY Right   . COLONOSCOPY N/A 07/03/2019   Procedure: COLONOSCOPY;  Surgeon: RRogene Houston MD;  Location: [facility];  Service: Endoscopy;  Laterality: N/A;  830  . pilonidal cyst  Family History  Problem Relation Age of Onset  . Diabetes Mother   . Hypertension Mother   . Hypertension Father   . Heart attack Father   . Hypertension Sister   . Diabetes Brother   . Diabetes Sister     No Known Allergies  Current Outpatient Medications on File Prior to Visit  Medication Sig Dispense Refill  . aspirin EC 81 MG tablet Take 81 mg daily by mouth.    . Biotin 10000 MCG TABS Take 1 tablet daily by mouth.    . Blood Glucose Monitoring Suppl (ONE TOUCH ULTRA 2) w/Device KIT Test blood sugar twice daily 1 kit 0  . Calcium Carbonate-Vit D-Min (CALCIUM-VITAMIN D-MINERALS) 600-800 MG-UNIT CHEW Chew 1 tablet daily by mouth.    . Cholecalciferol (VITAMIN D) 50 MCG (2000 UT) tablet Take 2,000 Units by mouth daily.    Marland Kitchen GLUCOSAMINE-CHONDROITIN PO Take 1 tablet by mouth daily. 1500 mg glucosamine and 1200 mg of choindroitan     . glucose blood (ONE  TOUCH ULTRA TEST) test strip Test blood sugar twice daily 100 each 12  . Lancets (ONETOUCH ULTRASOFT) lancets Use as instructed 100 each 12  . Multiple Vitamins-Minerals (MULTIVITAMIN WOMEN 50+) TABS Take 1 tablet daily by mouth.    . Omega-3 Fatty Acids (SUPER OMEGA 3 EPA/DHA PO) Take 660 mg by mouth daily.    . vitamin E 400 UNIT capsule Take 400 Units daily by mouth.     No current facility-administered medications on file prior to visit.    BP 140/70   Temp 97.9 F (36.6 C) (Temporal)   Wt 110 lb (49.9 kg)   BMI 19.49 kg/m         Objective:   Physical Exam Vitals and nursing note reviewed.  Cardiovascular:     Rate and Rhythm: Normal rate and regular rhythm.     Pulses: Normal pulses.     Heart sounds: Normal heart sounds.  Pulmonary:     Effort: Pulmonary effort is normal.     Breath sounds: Normal breath sounds.  Abdominal:     General: Abdomen is flat.     Palpations: Abdomen is soft.  Musculoskeletal:        General: Normal range of motion.  Skin:    General: Skin is warm and dry.     Capillary Refill: Capillary refill takes less than 2 seconds.  Neurological:     General: No focal deficit present.     Mental Status: She is oriented to person, place, and time.  Psychiatric:        Mood and Affect: Mood normal.        Behavior: Behavior normal.        Thought Content: Thought content normal.        Judgment: Judgment normal.       Assessment & Plan:  1. Controlled type 2 diabetes mellitus with complication, without long-term current use of insulin (HCC)  - POCT A1C - 5.6  - Very well controlled with diet.  - Follow up in 6 months at CPE or sooner if needed  Dorothyann Peng, NP

## 2019-10-18 DIAGNOSIS — E113293 Type 2 diabetes mellitus with mild nonproliferative diabetic retinopathy without macular edema, bilateral: Secondary | ICD-10-CM | POA: Diagnosis not present

## 2019-10-18 DIAGNOSIS — H25013 Cortical age-related cataract, bilateral: Secondary | ICD-10-CM | POA: Diagnosis not present

## 2019-10-18 DIAGNOSIS — H2513 Age-related nuclear cataract, bilateral: Secondary | ICD-10-CM | POA: Diagnosis not present

## 2019-10-18 DIAGNOSIS — H524 Presbyopia: Secondary | ICD-10-CM | POA: Diagnosis not present

## 2019-10-18 LAB — HM DIABETES EYE EXAM

## 2019-10-22 ENCOUNTER — Encounter: Payer: Self-pay | Admitting: Family Medicine

## 2020-03-17 ENCOUNTER — Encounter: Payer: Self-pay | Admitting: Adult Health

## 2020-03-17 ENCOUNTER — Ambulatory Visit (INDEPENDENT_AMBULATORY_CARE_PROVIDER_SITE_OTHER): Payer: Medicare Other | Admitting: Adult Health

## 2020-03-17 ENCOUNTER — Other Ambulatory Visit: Payer: Self-pay | Admitting: Adult Health

## 2020-03-17 ENCOUNTER — Other Ambulatory Visit: Payer: Self-pay

## 2020-03-17 VITALS — BP 136/80 | HR 65 | Temp 98.1°F | Ht 63.0 in | Wt 111.4 lb

## 2020-03-17 DIAGNOSIS — M810 Age-related osteoporosis without current pathological fracture: Secondary | ICD-10-CM

## 2020-03-17 DIAGNOSIS — Z0000 Encounter for general adult medical examination without abnormal findings: Secondary | ICD-10-CM

## 2020-03-17 DIAGNOSIS — E782 Mixed hyperlipidemia: Secondary | ICD-10-CM | POA: Diagnosis not present

## 2020-03-17 DIAGNOSIS — E118 Type 2 diabetes mellitus with unspecified complications: Secondary | ICD-10-CM

## 2020-03-17 DIAGNOSIS — Z23 Encounter for immunization: Secondary | ICD-10-CM | POA: Diagnosis not present

## 2020-03-17 MED ORDER — GLUCOSE BLOOD VI STRP: ORAL_STRIP | 12 refills | Status: DC

## 2020-03-17 MED ORDER — ONETOUCH ULTRASOFT LANCETS MISC: 12 refills | Status: DC

## 2020-03-17 NOTE — Addendum Note (Signed)
Addended by: Carola Rhine on: 03/17/2020 09:52 AM   Modules accepted: Orders

## 2020-03-17 NOTE — Patient Instructions (Addendum)
It was great seeing you today   Continue to stay active and eat healthy   We will follow up with you regarding you blood work   Schedule your bone density test at the [facility]   I will see you back in 6 months for diabetes check

## 2020-03-17 NOTE — Progress Notes (Signed)
Subjective:    Patient ID: Susan Schmidt, female    DOB: 09-09-1945, 74 y.o.   MRN: [id number]  HPI Patient presents for yearly preventative medicine examination. She is a pleasant 74 year old female who  has a past medical history of DM (diabetes mellitus), type 2, uncontrolled ([place]), Hyperlipidemia, and Osteoporosis.  DM -currently diet controlled.  She does monitor her blood sugars twice a day and reports readings consistently in the 120s to 130s.  She is eating a heart healthy diet and staying active with exercise and working in the yard. Lab Results  Component Value Date   HGBA1C 5.6 09/13/2019   Vitamin D Deficiency - takes 2000 units daily.   Osteoporosis -last DEXA scan was in 12 of 2018.  She has declined treatment with Prolia in the past due to side effects of the medication.  She is currently taking vitamin D and calcium supplements.  Hyperlipidemia-has a history of mildly elevated LDL.  Not currently on medication  All immunizations and health maintenance protocols were reviewed with the patient and needed orders were placed.  She is up-to-date on routine vaccinations   Appropriate screening laboratory values were ordered for the patient including screening of hyperlipidemia, renal function and hepatic function.  Medication reconciliation,  past medical history, social history, problem list and allergies were reviewed in detail with the patient  Goals were established with regard to weight loss, exercise, and  diet in compliance with medications  She is up-to-date on routine screening mammograms and colon cancer screening    Review of Systems  Constitutional: Negative.   HENT: Negative.   Eyes: Negative.   Respiratory: Negative.   Cardiovascular: Negative.   Gastrointestinal: Negative.   Endocrine: Negative.   Genitourinary: Negative.   Musculoskeletal: Negative.   Skin: Negative.   Allergic/Immunologic: Negative.   Neurological: Negative.     Hematological: Negative.   Psychiatric/Behavioral: Negative.    Past Medical History:  Diagnosis Date   DM (diabetes mellitus), type 2, uncontrolled ([place])    Hyperlipidemia    Osteoporosis     Social History   Socioeconomic History   Marital status: Single    Spouse name: Not on file   Number of children: Not on file   Years of education: Not on file   Highest education level: Not on file  Occupational History   Not on file  Tobacco Use   Smoking status: Former Smoker    Packs/day: 2.00    Types: Cigarettes    Start date: 07/11/1964    Quit date: 07/11/1976    Years since quitting: 43.7   Smokeless tobacco: Never Used  Vaping Use   Vaping Use: Never used  Substance and Sexual Activity   Alcohol use: No   Drug use: Never   Sexual activity: Not on file  Other Topics Concern   Not on file  Social History Narrative   Retired    Not married    No kids          Social Determinants of Health   Financial Resource Strain:    Difficulty of Paying Living Expenses: Not on file  Food Insecurity:    Worried About Charity fundraiser in the Last Year: Not on file   [place] in the Last Year: Not on file  Transportation Needs:    Lack of Transportation (Medical): Not on file   Lack of Transportation (Non-Medical): Not on file  Physical Activity:    Days of  Exercise per Week: Not on file   Minutes of Exercise per Session: Not on file  Stress:    Feeling of Stress : Not on file  Social Connections:    Frequency of Communication with Friends and Family: Not on file   Frequency of Social Gatherings with Friends and Family: Not on file   Attends Religious Services: Not on file   Active Member of Clubs or Organizations: Not on file   Attends Archivist Meetings: Not on file   Marital Status: Not on file  Intimate Partner Violence:    Fear of Current or Ex-Partner: Not on file   Emotionally Abused: Not on file   Physically  Abused: Not on file   Sexually Abused: Not on file    Past Surgical History:  Procedure Laterality Date   ANKLE SURGERY Right    COLONOSCOPY N/A 07/03/2019   Procedure: COLONOSCOPY;  Surgeon: Rogene Houston, MD;  [facility];  Service: Endoscopy;  Laterality: N/A;  69   pilonidal cyst      Family History  Problem Relation Age of Onset   Diabetes Mother    Hypertension Mother    Hypertension Father    Heart attack Father    Hypertension Sister    Diabetes Brother    Diabetes Sister     No Known Allergies  Current Outpatient Medications on File Prior to Visit  Medication Sig Dispense Refill   aspirin EC 81 MG tablet Take 81 mg daily by mouth.     Biotin 10000 MCG TABS Take 1 tablet daily by mouth.     Blood Glucose Monitoring Suppl (ONE TOUCH ULTRA 2) w/Device KIT Test blood sugar twice daily 1 kit 0   Calcium Carbonate-Vit D-Min (CALCIUM-VITAMIN D-MINERALS) 600-800 MG-UNIT CHEW Chew 1 tablet daily by mouth.     Cholecalciferol (VITAMIN D) 50 MCG (2000 UT) tablet Take 2,000 Units by mouth daily.     GLUCOSAMINE-CHONDROITIN PO Take 1 tablet by mouth daily. 1500 mg glucosamine and 1200 mg of choindroitan      glucose blood (ONE TOUCH ULTRA TEST) test strip Test blood sugar twice daily 100 each 12   Lancets (ONETOUCH ULTRASOFT) lancets Use as instructed 100 each 12   Multiple Vitamins-Minerals (MULTIVITAMIN WOMEN 50+) TABS Take 1 tablet daily by mouth.     Omega-3 Fatty Acids (SUPER OMEGA 3 EPA/DHA PO) Take 660 mg by mouth daily.     vitamin E 400 UNIT capsule Take 400 Units daily by mouth.     No current facility-administered medications on file prior to visit.    BP 136/80 (BP Location: Left Arm, Patient Position: Sitting, Cuff Size: Normal)    Pulse 65    Temp 98.1 F (36.7 C) (Oral)    Ht _0  (1.6 m)    Wt 111 lb 6.4 oz (50.5 kg)    SpO2 97%    BMI 19.73 kg/m       Objective:   Physical Exam Vitals and nursing note reviewed.    Constitutional:      General: She is not in acute distress.    Appearance: Normal appearance. She is well-developed. She is not ill-appearing.  HENT:     Head: Normocephalic and atraumatic.     Right Ear: Tympanic membrane, ear canal and external ear normal. There is no impacted cerumen.     Left Ear: Tympanic membrane, ear canal and external ear normal. There is no impacted cerumen.     Nose: Nose  normal. No congestion or rhinorrhea.     Mouth/Throat:     Mouth: Mucous membranes are moist.     Pharynx: Oropharynx is clear. No oropharyngeal exudate or posterior oropharyngeal erythema.  Eyes:     General:        Right eye: No discharge.        Left eye: No discharge.     Extraocular Movements: Extraocular movements intact.     Conjunctiva/sclera: Conjunctivae normal.     Pupils: Pupils are equal, round, and reactive to light.  Neck:     Thyroid: No thyromegaly.     Vascular: No carotid bruit.     Trachea: No tracheal deviation.  Cardiovascular:     Rate and Rhythm: Normal rate and regular rhythm.     Pulses: Normal pulses.     Heart sounds: Normal heart sounds. No murmur heard.  No friction rub. No gallop.   Pulmonary:     Effort: Pulmonary effort is normal. No respiratory distress.     Breath sounds: Normal breath sounds. No stridor. No wheezing, rhonchi or rales.  Chest:     Chest wall: No tenderness.  Abdominal:     General: Abdomen is flat. Bowel sounds are normal. There is no distension.     Palpations: Abdomen is soft. There is no mass.     Tenderness: There is no abdominal tenderness. There is no right CVA tenderness, left CVA tenderness, guarding or rebound.     Hernia: No hernia is present.  Musculoskeletal:        General: No swelling, tenderness, deformity or signs of injury. Normal range of motion.     Cervical back: Normal range of motion and neck supple.     Right lower leg: No edema.     Left lower leg: No edema.  Lymphadenopathy:     Cervical: No cervical  adenopathy.  Skin:    General: Skin is warm and dry.     Coloration: Skin is not jaundiced or pale.     Findings: No bruising, erythema, lesion or rash.  Neurological:     General: No focal deficit present.     Mental Status: She is alert and oriented to person, place, and time.     Cranial Nerves: No cranial nerve deficit.     Sensory: No sensory deficit.     Motor: No weakness.     Coordination: Coordination normal.     Gait: Gait normal.     Deep Tendon Reflexes: Reflexes normal.  Psychiatric:        Mood and Affect: Mood normal.        Behavior: Behavior normal.        Thought Content: Thought content normal.        Judgment: Judgment normal.       Assessment & Plan:  1. Routine general medical examination at a health care facility - Continue to stay active and exercise  - Follow up in one year or sooner if needed - CBC with Differential/Platelet; Future - Hemoglobin A1c; Future - Lipid panel; Future - TSH; Future - CMP with eGFR([facility]); Future - DG Bone Density; Future  2. Controlled type 2 diabetes mellitus with complication, without long-term current use of insulin (HCC)  - Consider Metformin  - Follow up in 6 months or sooner if needed - CBC with Differential/Platelet; Future - Hemoglobin A1c; Future - Lipid panel; Future - TSH; Future - CMP with eGFR([facility]); Future - DG Bone Density; Future - Lancets [facility]  ULTRASOFT) lancets; Use as instructed  Dispense: 100 each; Refill: 12 - glucose blood (ONE TOUCH ULTRA TEST) test strip; Test blood sugar twice daily  Dispense: 100 each; Refill: 12  3. Mixed hyperlipidemia - Consider statin  - CBC with Differential/Platelet; Future - Hemoglobin A1c; Future - Lipid panel; Future - TSH; Future - CMP with eGFR([facility]); Future - DG Bone Density; Future  4. Age-related osteoporosis without current pathological fracture  - DG Bone Density; Future - VITAMIN D 25 Hydroxy (Vit-D Deficiency, Fractures); Future  Dorothyann Peng, NP

## 2020-03-18 LAB — CBC WITH DIFFERENTIAL/PLATELET
Absolute Monocytes: 358 cells/uL (ref 200–950)
Basophils Absolute: 22 cells/uL (ref 0–200)
Basophils Relative: 0.4 %
Eosinophils Absolute: 28 cells/uL (ref 15–500)
Eosinophils Relative: 0.5 %
HCT: 43.2 % (ref 35.0–45.0)
Hemoglobin: 14.7 g/dL (ref 11.7–15.5)
Lymphs Abs: 1848 cells/uL (ref 850–3900)
MCH: 32.3 pg (ref 27.0–33.0)
MCHC: 34 g/dL (ref 32.0–36.0)
MCV: 94.9 fL (ref 80.0–100.0)
MPV: 10.3 fL (ref 7.5–12.5)
Monocytes Relative: 6.5 %
Neutro Abs: 3245 cells/uL (ref 1500–7800)
Neutrophils Relative %: 59 %
Platelets: 194 10*3/uL (ref 140–400)
RBC: 4.55 10*6/uL (ref 3.80–5.10)
RDW: 12 % (ref 11.0–15.0)
Total Lymphocyte: 33.6 %
WBC: 5.5 10*3/uL (ref 3.8–10.8)

## 2020-03-18 LAB — COMPLETE METABOLIC PANEL WITH GFR
AG Ratio: 1.7 (calc) (ref 1.0–2.5)
ALT: 13 U/L (ref 6–29)
AST: 19 U/L (ref 10–35)
Albumin: 4.7 g/dL (ref 3.6–5.1)
Alkaline phosphatase (APISO): 54 U/L (ref 37–153)
BUN: 9 mg/dL (ref 7–25)
CO2: 30 mmol/L (ref 20–32)
Calcium: 10.1 mg/dL (ref 8.6–10.4)
Chloride: 102 mmol/L (ref 98–110)
Creat: 0.69 mg/dL (ref 0.60–0.93)
GFR, Est African American: 99 mL/min/{1.73_m2} (ref >=60)
GFR, Est Non African American: 85 mL/min/{1.73_m2} (ref >=60)
Globulin: 2.7 g/dL (calc) (ref 1.9–3.7)
Glucose, Bld: 124 mg/dL — ABNORMAL HIGH (ref 65–99)
Potassium: 4 mmol/L (ref 3.5–5.3)
Sodium: 139 mmol/L (ref 135–146)
Total Bilirubin: 0.6 mg/dL (ref 0.2–1.2)
Total Protein: 7.4 g/dL (ref 6.1–8.1)

## 2020-03-18 LAB — LIPID PANEL
Cholesterol: 206 mg/dL — ABNORMAL HIGH (ref <200)
HDL: 62 mg/dL (ref >=50)
LDL Cholesterol (Calc): 117 mg/dL (calc) — ABNORMAL HIGH
Non-HDL Cholesterol (Calc): 144 mg/dL (calc) — ABNORMAL HIGH (ref <130)
Total CHOL/HDL Ratio: 3.3 (calc) (ref <5.0)
Triglycerides: 154 mg/dL — ABNORMAL HIGH (ref <150)

## 2020-03-18 LAB — HEMOGLOBIN A1C
Hgb A1c MFr Bld: 5.7 % of total Hgb — ABNORMAL HIGH (ref <5.7)
Mean Plasma Glucose: 117 (calc)
eAG (mmol/L): 6.5 (calc)

## 2020-03-18 LAB — VITAMIN D 25 HYDROXY (VIT D DEFICIENCY, FRACTURES): Vit D, 25-Hydroxy: 38 ng/mL (ref 30–100)

## 2020-03-18 LAB — TSH: TSH: 3.33 mIU/L (ref 0.40–4.50)

## 2020-03-18 MED ORDER — ROSUVASTATIN CALCIUM 5 MG PO TABS: 5.0000 mg | ORAL_TABLET | Freq: Every day | ORAL | 1 refills | Status: DC

## 2020-03-20 ENCOUNTER — Telehealth: Payer: Self-pay | Admitting: Adult Health

## 2020-03-20 NOTE — Telephone Encounter (Signed)
Left message for patient to schedule Annual Wellness Visit.  Please schedule with Nurse Health Advisor Shannon Crews, RN at [facility]  

## 2020-03-25 ENCOUNTER — Inpatient Hospital Stay (HOSPITAL_COMMUNITY)
Admission: EM | Admit: 2020-03-25 | Discharge: 2020-03-30 | DRG: 481 | Disposition: A | Discharge status: Skilled Nursing Facility | Payer: Medicare Other | Attending: Internal Medicine | Admitting: Internal Medicine

## 2020-03-25 ENCOUNTER — Encounter (HOSPITAL_COMMUNITY): Payer: Self-pay

## 2020-03-25 ENCOUNTER — Emergency Department (HOSPITAL_COMMUNITY): Payer: Medicare Other

## 2020-03-25 ENCOUNTER — Other Ambulatory Visit: Payer: Self-pay

## 2020-03-25 DIAGNOSIS — Z8249 Family history of ischemic heart disease and other diseases of the circulatory system: Secondary | ICD-10-CM

## 2020-03-25 DIAGNOSIS — R339 Retention of urine, unspecified: Secondary | ICD-10-CM | POA: Diagnosis not present

## 2020-03-25 DIAGNOSIS — E785 Hyperlipidemia, unspecified: Secondary | ICD-10-CM | POA: Diagnosis present

## 2020-03-25 DIAGNOSIS — W010XXA Fall on same level from slipping, tripping and stumbling without subsequent striking against object, initial encounter: Secondary | ICD-10-CM | POA: Diagnosis present

## 2020-03-25 DIAGNOSIS — S72009A Fracture of unspecified part of neck of unspecified femur, initial encounter for closed fracture: Secondary | ICD-10-CM

## 2020-03-25 DIAGNOSIS — E1169 Type 2 diabetes mellitus with other specified complication: Secondary | ICD-10-CM | POA: Diagnosis not present

## 2020-03-25 DIAGNOSIS — W19XXXA Unspecified fall, initial encounter: Secondary | ICD-10-CM | POA: Diagnosis not present

## 2020-03-25 DIAGNOSIS — I10 Essential (primary) hypertension: Secondary | ICD-10-CM | POA: Diagnosis present

## 2020-03-25 DIAGNOSIS — M810 Age-related osteoporosis without current pathological fracture: Secondary | ICD-10-CM | POA: Diagnosis not present

## 2020-03-25 DIAGNOSIS — E876 Hypokalemia: Secondary | ICD-10-CM | POA: Diagnosis not present

## 2020-03-25 DIAGNOSIS — D62 Acute posthemorrhagic anemia: Secondary | ICD-10-CM

## 2020-03-25 DIAGNOSIS — R2689 Other abnormalities of gait and mobility: Secondary | ICD-10-CM | POA: Diagnosis not present

## 2020-03-25 DIAGNOSIS — Z79899 Other long term (current) drug therapy: Secondary | ICD-10-CM | POA: Diagnosis not present

## 2020-03-25 DIAGNOSIS — R110 Nausea: Secondary | ICD-10-CM | POA: Diagnosis not present

## 2020-03-25 DIAGNOSIS — E1165 Type 2 diabetes mellitus with hyperglycemia: Secondary | ICD-10-CM | POA: Diagnosis not present

## 2020-03-25 DIAGNOSIS — R6889 Other general symptoms and signs: Secondary | ICD-10-CM | POA: Diagnosis not present

## 2020-03-25 DIAGNOSIS — E119 Type 2 diabetes mellitus without complications: Secondary | ICD-10-CM | POA: Diagnosis not present

## 2020-03-25 DIAGNOSIS — R0689 Other abnormalities of breathing: Secondary | ICD-10-CM | POA: Diagnosis not present

## 2020-03-25 DIAGNOSIS — Z9181 History of falling: Secondary | ICD-10-CM | POA: Diagnosis not present

## 2020-03-25 DIAGNOSIS — Z87891 Personal history of nicotine dependence: Secondary | ICD-10-CM

## 2020-03-25 DIAGNOSIS — M25552 Pain in left hip: Secondary | ICD-10-CM | POA: Diagnosis not present

## 2020-03-25 DIAGNOSIS — S72142A Displaced intertrochanteric fracture of left femur, initial encounter for closed fracture: Principal | ICD-10-CM | POA: Diagnosis present

## 2020-03-25 DIAGNOSIS — Z833 Family history of diabetes mellitus: Secondary | ICD-10-CM | POA: Diagnosis not present

## 2020-03-25 DIAGNOSIS — Z743 Need for continuous supervision: Secondary | ICD-10-CM | POA: Diagnosis not present

## 2020-03-25 DIAGNOSIS — S72142D Displaced intertrochanteric fracture of left femur, subsequent encounter for closed fracture with routine healing: Secondary | ICD-10-CM | POA: Diagnosis not present

## 2020-03-25 DIAGNOSIS — S7292XA Unspecified fracture of left femur, initial encounter for closed fracture: Secondary | ICD-10-CM | POA: Diagnosis not present

## 2020-03-25 DIAGNOSIS — L0591 Pilonidal cyst without abscess: Secondary | ICD-10-CM | POA: Diagnosis present

## 2020-03-25 DIAGNOSIS — S72002A Fracture of unspecified part of neck of left femur, initial encounter for closed fracture: Secondary | ICD-10-CM | POA: Diagnosis not present

## 2020-03-25 DIAGNOSIS — Z20822 Contact with and (suspected) exposure to covid-19: Secondary | ICD-10-CM | POA: Diagnosis present

## 2020-03-25 DIAGNOSIS — R52 Pain, unspecified: Secondary | ICD-10-CM | POA: Diagnosis not present

## 2020-03-25 DIAGNOSIS — Z01818 Encounter for other preprocedural examination: Secondary | ICD-10-CM | POA: Diagnosis not present

## 2020-03-25 DIAGNOSIS — R2681 Unsteadiness on feet: Secondary | ICD-10-CM | POA: Diagnosis not present

## 2020-03-25 DIAGNOSIS — S72002D Fracture of unspecified part of neck of left femur, subsequent encounter for closed fracture with routine healing: Secondary | ICD-10-CM | POA: Diagnosis not present

## 2020-03-25 DIAGNOSIS — Z7401 Bed confinement status: Secondary | ICD-10-CM | POA: Diagnosis not present

## 2020-03-25 DIAGNOSIS — Y92017 Garden or yard in single-family (private) house as the place of occurrence of the external cause: Secondary | ICD-10-CM

## 2020-03-25 DIAGNOSIS — M6281 Muscle weakness (generalized): Secondary | ICD-10-CM | POA: Diagnosis not present

## 2020-03-25 DIAGNOSIS — T699XXA Effect of reduced temperature, unspecified, initial encounter: Secondary | ICD-10-CM | POA: Diagnosis not present

## 2020-03-25 DIAGNOSIS — S72002B Fracture of unspecified part of neck of left femur, initial encounter for open fracture type I or II: Secondary | ICD-10-CM

## 2020-03-25 LAB — CBC WITH DIFFERENTIAL/PLATELET
Abs Immature Granulocytes: 0.12 10*3/uL — ABNORMAL HIGH (ref 0.00–0.07)
Basophils Absolute: 0 10*3/uL (ref 0.0–0.1)
Basophils Relative: 0 %
Eosinophils Absolute: 0 10*3/uL (ref 0.0–0.5)
Eosinophils Relative: 0 %
HCT: 35.9 % — ABNORMAL LOW (ref 36.0–46.0)
Hemoglobin: 12.3 g/dL (ref 12.0–15.0)
Immature Granulocytes: 1 %
Lymphocytes Relative: 11 %
Lymphs Abs: 1.8 10*3/uL (ref 0.7–4.0)
MCH: 31.9 pg (ref 26.0–34.0)
MCHC: 34.3 g/dL (ref 30.0–36.0)
MCV: 93 fL (ref 80.0–100.0)
Monocytes Absolute: 0.8 10*3/uL (ref 0.1–1.0)
Monocytes Relative: 5 %
Neutro Abs: 13.3 10*3/uL — ABNORMAL HIGH (ref 1.7–7.7)
Neutrophils Relative %: 83 %
Platelets: 180 10*3/uL (ref 150–400)
RBC: 3.86 MIL/uL — ABNORMAL LOW (ref 3.87–5.11)
RDW: 12.2 % (ref 11.5–15.5)
WBC: 16 10*3/uL — ABNORMAL HIGH (ref 4.0–10.5)
nRBC: 0 % (ref 0.0–0.2)

## 2020-03-25 LAB — BASIC METABOLIC PANEL
Anion gap: 10 (ref 5–15)
BUN: 11 mg/dL (ref 8–23)
CO2: 24 mmol/L (ref 22–32)
Calcium: 8.8 mg/dL — ABNORMAL LOW (ref 8.9–10.3)
Chloride: 98 mmol/L (ref 98–111)
Creatinine, Ser: 0.59 mg/dL (ref 0.44–1.00)
GFR calc Af Amer: >60 mL/min (ref >60)
GFR calc non Af Amer: >60 mL/min (ref >60)
Glucose, Bld: 184 mg/dL — ABNORMAL HIGH (ref 70–99)
Potassium: 3.4 mmol/L — ABNORMAL LOW (ref 3.5–5.1)
Sodium: 132 mmol/L — ABNORMAL LOW (ref 135–145)

## 2020-03-25 LAB — TYPE AND SCREEN
ABO/RH(D): A POS
Antibody Screen: NEGATIVE

## 2020-03-25 LAB — PROTIME-INR
INR: 1.1 (ref 0.8–1.2)
Prothrombin Time: 13.4 seconds (ref 11.4–15.2)

## 2020-03-25 LAB — SARS CORONAVIRUS 2 BY RT PCR (HOSPITAL ORDER, PERFORMED IN ~~LOC~~ HOSPITAL LAB): SARS Coronavirus 2: NEGATIVE

## 2020-03-25 LAB — ABO/RH: ABO/RH(D): A POS

## 2020-03-25 MED ORDER — ACETAMINOPHEN 650 MG RE SUPP: 650.0000 mg | Freq: Four times a day (QID) | RECTAL | Status: DC | PRN

## 2020-03-25 MED ORDER — OXYCODONE HCL 5 MG PO TABS
5.0000 mg | ORAL_TABLET | ORAL | Status: DC | PRN
  Administered 2020-03-25: 5 mg via ORAL
  Filled 2020-03-25: qty 1

## 2020-03-25 MED ORDER — POTASSIUM CHLORIDE 10 MEQ/100ML IV SOLN
10.0000 meq | INTRAVENOUS | Status: AC
  Administered 2020-03-25 – 2020-03-26 (×2): 10 meq via INTRAVENOUS
  Filled 2020-03-25 (×2): qty 100

## 2020-03-25 MED ORDER — POLYETHYLENE GLYCOL 3350 17 G PO PACK: 17.0000 g | PACK | Freq: Every day | ORAL | Status: DC | PRN

## 2020-03-25 MED ORDER — ACETAMINOPHEN 325 MG PO TABS: 650.0000 mg | ORAL_TABLET | Freq: Four times a day (QID) | ORAL | Status: DC | PRN

## 2020-03-25 MED ORDER — ROSUVASTATIN CALCIUM 5 MG PO TABS
5.0000 mg | ORAL_TABLET | Freq: Every day | ORAL | Status: DC
  Administered 2020-03-26 – 2020-03-30 (×4): 5 mg via ORAL
  Filled 2020-03-25 (×5): qty 1
  Filled 2020-03-25 (×3): qty 0.5

## 2020-03-25 MED ORDER — MORPHINE SULFATE (PF) 4 MG/ML IV SOLN
4.0000 mg | INTRAVENOUS | Status: DC | PRN
  Administered 2020-03-27: 4 mg via INTRAVENOUS
  Filled 2020-03-25: qty 1

## 2020-03-25 MED ORDER — HEPARIN SODIUM (PORCINE) 5000 UNIT/ML IJ SOLN
5000.0000 [IU] | Freq: Three times a day (TID) | INTRAMUSCULAR | Status: AC
  Administered 2020-03-25 – 2020-03-26 (×4): 5000 [IU] via SUBCUTANEOUS
  Filled 2020-03-25 (×4): qty 1

## 2020-03-25 MED ORDER — CALCIUM CARBONATE-VITAMIN D 500-200 MG-UNIT PO TABS
1.0000 | ORAL_TABLET | Freq: Every day | ORAL | Status: DC
  Administered 2020-03-26 – 2020-03-30 (×4): 1 via ORAL
  Filled 2020-03-25 (×6): qty 1

## 2020-03-25 MED ORDER — ONDANSETRON HCL 4 MG/2ML IJ SOLN
4.0000 mg | Freq: Three times a day (TID) | INTRAMUSCULAR | Status: DC | PRN
  Administered 2020-03-27: 4 mg via INTRAVENOUS
  Filled 2020-03-25: qty 2

## 2020-03-25 MED ORDER — LACTATED RINGERS IV SOLN: INTRAVENOUS | Status: DC

## 2020-03-25 MED ORDER — MORPHINE SULFATE (PF) 4 MG/ML IV SOLN: 4.0000 mg | INTRAVENOUS | Status: DC | PRN

## 2020-03-25 MED ORDER — INSULIN ASPART 100 UNIT/ML ~~LOC~~ SOLN
0.0000 [IU] | Freq: Three times a day (TID) | SUBCUTANEOUS | Status: DC
  Administered 2020-03-26 (×2): 1 [IU] via SUBCUTANEOUS
  Administered 2020-03-26: 2 [IU] via SUBCUTANEOUS
  Administered 2020-03-27 – 2020-03-29 (×5): 1 [IU] via SUBCUTANEOUS
  Administered 2020-03-29: 2 [IU] via SUBCUTANEOUS
  Administered 2020-03-30 (×3): 1 [IU] via SUBCUTANEOUS
  Filled 2020-03-25 (×3): qty 1

## 2020-03-25 NOTE — ED Triage Notes (Signed)
Pt arrived via [facility] after falling at home. [facility] established IV access and administered 2mg  morphine and 4mg  Zofran enroute to [facility]. Pt presents with shortening of Left Lower Extremity with external rotation. Pt states she was outside bending over to pick a bell pepper when she fell. Friends found Pt and called EMS.

## 2020-03-25 NOTE — ED Provider Notes (Signed)
Emergency Department Provider Note   I have reviewed the triage vital signs and the nursing notes.   HISTORY  Chief Complaint Fall   HPI Susan Schmidt is a 74 y.o. female with DM (diet controlled), HTN (no meds), and HLD presents to the ED after mechanical fall in the yard today.  Patient was working in her garden when she lost her balance and fell backward landing on her left side.  She denies any head trauma or loss of consciousness.  She denies any presyncope symptoms prior to falling.  Denies any chest pain or shortness of breath.  She felt a "pop" in her left leg and could not stand after falling. EMS arrived and fount her left leg shortened and externally rotated. Patient denies any weakness/numbness. No neck pain. No HA. Patient takes an 81 mg ASA.    Past Medical History:  Diagnosis Date  . DM (diabetes mellitus), type 2, uncontrolled (HCC)   . Hyperlipidemia   . Osteoporosis     Patient Active Problem List   Diagnosis Date Noted  . Closed left hip fracture (HCC) 03/25/2020  . Special screening for malignant neoplasms, colon 04/05/2019  . Controlled diabetes mellitus (HCC) 01/04/2019  . Hyperlipidemia   . Age-related osteoporosis without current pathological fracture 01/01/2018  . Essential hypertension 01/01/2018    Past Surgical History:  Procedure Laterality Date  . ANKLE SURGERY Right   . COLONOSCOPY N/A 07/03/2019   Procedure: COLONOSCOPY;  Surgeon: Malissa Hippoehman, Najeeb U, MD;  [facility];  Service: Endoscopy;  Laterality: N/A;  830  . pilonidal cyst      Allergies Strawberry extract  Family History  Problem Relation Age of Onset  . Diabetes Mother   . Hypertension Mother   . Hypertension Father   . Heart attack Father   . Hypertension Sister   . Diabetes Brother   . Diabetes Sister     Social History Social History   Tobacco Use  . Smoking status: Former Smoker    Packs/day: 2.00    Types: Cigarettes    Start date:  07/11/1964    Quit date: 07/11/1976    Years since quitting: 43.7  . Smokeless tobacco: Never Used  Vaping Use  . Vaping Use: Never used  Substance Use Topics  . Alcohol use: No  . Drug use: Never    Review of Systems  Constitutional: No fever/chills Eyes: No visual changes. ENT: No sore throat. Cardiovascular: Denies chest pain. Respiratory: Denies shortness of breath. Gastrointestinal: No abdominal pain.  No nausea, no vomiting.  No diarrhea.  No constipation. Genitourinary: Negative for dysuria. Musculoskeletal: Negative for back pain. Positive left hip pain.  Skin: Negative for rash. Neurological: Negative for headaches, focal weakness or numbness.  10-point ROS otherwise negative.  ____________________________________________   PHYSICAL EXAM:  VITAL SIGNS: ED Triage Vitals  Enc Vitals Group     BP 03/25/20 1942 (!) 155/64     Pulse Rate 03/25/20 1942 79     Resp 03/25/20 1942 17     Temp 03/25/20 1942 97.9 F (36.6 C)     Temp Source 03/25/20 1942 Oral     SpO2 03/25/20 1942 98 %     Weight 03/25/20 1938 111 lb (50.3 kg)     Height 03/25/20 1938 5\' 3"  (1.6 m)   Constitutional: Alert and oriented. Well appearing and in no acute distress. Eyes: Conjunctivae are normal.  Head: Atraumatic. Nose: No congestion/rhinnorhea. Mouth/Throat: Mucous membranes are moist.  Neck: No  stridor.  No cervical spine tenderness to palpation. Cardiovascular: Normal rate, regular rhythm. Good peripheral circulation. Grossly normal heart sounds.   Respiratory: Normal respiratory effort.  No retractions. Lungs CTAB. Gastrointestinal: Soft and nontender. No distention.  Musculoskeletal: LLE is shortened and externally rotated. Intact pulses and sensation.  Neurologic:  Normal speech and language. No gross focal neurologic deficits are appreciated.  Skin:  Skin is warm, dry and intact. No rash noted.  ____________________________________________   LABS (all labs ordered are listed,  but only abnormal results are displayed)  Labs Reviewed  BASIC METABOLIC PANEL - Abnormal; Notable for the following components:      Result Value   Sodium 132 (*)    Potassium 3.4 (*)    Glucose, Bld 184 (*)    Calcium 8.8 (*)    All other components within normal limits  CBC WITH DIFFERENTIAL/PLATELET - Abnormal; Notable for the following components:   WBC 16.0 (*)    RBC 3.86 (*)    HCT 35.9 (*)    Neutro Abs 13.3 (*)    Abs Immature Granulocytes 0.12 (*)    All other components within normal limits  COMPREHENSIVE METABOLIC PANEL - Abnormal; Notable for the following components:   Sodium 130 (*)    Glucose, Bld 192 (*)    Calcium 8.7 (*)    Total Protein 5.9 (*)    Albumin 3.2 (*)    All other components within normal limits  CBC - Abnormal; Notable for the following components:   WBC 13.3 (*)    RBC 3.28 (*)    Hemoglobin 10.4 (*)    HCT 30.8 (*)    All other components within normal limits  GLUCOSE, CAPILLARY - Abnormal; Notable for the following components:   Glucose-Capillary 134 (*)    All other components within normal limits  GLUCOSE, CAPILLARY - Abnormal; Notable for the following components:   Glucose-Capillary 123 (*)    All other components within normal limits  GLUCOSE, CAPILLARY - Abnormal; Notable for the following components:   Glucose-Capillary 112 (*)    All other components within normal limits  GLUCOSE, CAPILLARY - Abnormal; Notable for the following components:   Glucose-Capillary 119 (*)    All other components within normal limits  GLUCOSE, CAPILLARY - Abnormal; Notable for the following components:   Glucose-Capillary 135 (*)    All other components within normal limits  CBC - Abnormal; Notable for the following components:   RBC 2.37 (*)    Hemoglobin 7.6 (*)    HCT 22.6 (*)    Platelets 132 (*)    All other components within normal limits  BASIC METABOLIC PANEL - Abnormal; Notable for the following components:   Glucose, Bld 140 (*)     Calcium 8.0 (*)    All other components within normal limits  GLUCOSE, CAPILLARY - Abnormal; Notable for the following components:   Glucose-Capillary 152 (*)    All other components within normal limits  GLUCOSE, CAPILLARY - Abnormal; Notable for the following components:   Glucose-Capillary 130 (*)    All other components within normal limits  GLUCOSE, CAPILLARY - Abnormal; Notable for the following components:   Glucose-Capillary 110 (*)    All other components within normal limits  CBG MONITORING, ED - Abnormal; Notable for the following components:   Glucose-Capillary 173 (*)    All other components within normal limits  CBG MONITORING, ED - Abnormal; Notable for the following components:   Glucose-Capillary 150 (*)  All other components within normal limits  CBG MONITORING, ED - Abnormal; Notable for the following components:   Glucose-Capillary 155 (*)    All other components within normal limits  CBG MONITORING, ED - Abnormal; Notable for the following components:   Glucose-Capillary 147 (*)    All other components within normal limits  SARS CORONAVIRUS 2 BY RT PCR (HOSPITAL ORDER, PERFORMED IN [facility])  SURGICAL PCR SCREEN  PROTIME-INR  MAGNESIUM  VITAMIN B12  URINALYSIS, ROUTINE W REFLEX MICROSCOPIC  TYPE AND SCREEN  ABO/RH   ____________________________________________  EKG   EKG Interpretation  Date/Time:  Wednesday March 25 2020 19:44:08 EDT Ventricular Rate:  78 PR Interval:    QRS Duration: 86 QT Interval:  405 QTC Calculation: 462 R Axis:   80 Text Interpretation: Sinus rhythm Anteroseptal infarct, old Borderline repolarization abnormality Confirmed by Cherlynn Perches ([id number]) on 03/26/2020 9:57:32 AM       ____________________________________________  RADIOLOGY  DG Hip Unilat W or Wo Pelvis 2-3 Views Left  Result Date: 03/25/2020 CLINICAL DATA:  74 year old female status post fall with left hip pain. EXAM: DG HIP (WITH OR  WITHOUT PELVIS) 2-3V LEFT COMPARISON:  None. FINDINGS: Highly comminuted and moderately impacted left femur intertrochanteric fracture. Displaced butterfly fragments. Left femoral head remains normally located. The pelvis appears to remain intact. Grossly intact proximal right femur. Negative lower abdominal and pelvic visceral contours. IMPRESSION: Highly comminuted left femur intertrochanteric fracture with varus impaction and displaced butterfly fragments. Electronically Signed   By: Odessa Fleming M.D.   On: 03/25/2020 20:14    ____________________________________________   PROCEDURES  Procedure(s) performed:   Procedures  None  ____________________________________________   INITIAL IMPRESSION / ASSESSMENT AND PLAN / ED COURSE  Pertinent labs & imaging results that were available during my care of the patient were reviewed by me and considered in my medical decision making (see chart for details).   Patient arrives to the emergency department with clinically fractured left hip.  This was confirmed on x-ray showing an intertrochanteric fracture.  Patient is intact pulses and sensation.  Discussed the patient with Dr. Romeo Apple who will consult in the morning for likely repair tomorrow. Sending pre-op labs, CXR, and EKG.   Discussed patient's case with [facility] to request admission. Patient and family (if present) updated with plan. Care transferred to [facility] service.  I reviewed all nursing notes, vitals, pertinent old records, EKGs, labs, imaging (as available).  ____________________________________________  FINAL CLINICAL IMPRESSION(S) / ED DIAGNOSES  Final diagnoses:  Closed fracture of left hip, initial encounter (HCC)  Fall, initial encounter     MEDICATIONS GIVEN DURING THIS VISIT:  Medications  ondansetron (ZOFRAN) injection 4 mg ( Intravenous MAR Unhold 03/27/20 1450)  lactated ringers infusion ( Intravenous Stopped 03/27/20 1439)  rosuvastatin (CRESTOR) tablet 5 mg (5 mg Oral Given  03/28/20 1139)  calcium-vitamin D (OSCAL WITH D) 500-200 MG-UNIT per tablet 1 tablet (1 tablet Oral Given 03/28/20 0949)  polyethylene glycol (MIRALAX / GLYCOLAX) packet 17 g ( Oral MAR Unhold 03/27/20 1450)  insulin aspart (novoLOG) injection 0-9 Units (0 Units Subcutaneous Not Given 03/28/20 1310)  Chlorhexidine Gluconate Cloth 2 % PADS 6 each (6 each Topical Given 03/28/20 1139)  docusate sodium (COLACE) capsule 100 mg (100 mg Oral Given 03/28/20 0949)  ondansetron (ZOFRAN) tablet 4 mg (has no administration in time range)    Or  ondansetron (ZOFRAN) injection 4 mg (has no administration in time range)  metoCLOPramide (REGLAN) tablet 5-10 mg (has no administration  in time range)    Or  metoCLOPramide (REGLAN) injection 5-10 mg (has no administration in time range)  menthol-cetylpyridinium (CEPACOL) lozenge 3 mg (has no administration in time range)    Or  phenol (CHLORASEPTIC) mouth spray 1 spray (has no administration in time range)  aspirin EC tablet 325 mg (325 mg Oral Given 03/28/20 0949)  0.9 %  sodium chloride infusion ( Intravenous New Bag/Given 03/27/20 2129)  traMADol (ULTRAM) tablet 50 mg (50 mg Oral Given 03/28/20 1310)  morphine 2 MG/ML injection 0.5-1 mg (has no administration in time range)  methocarbamol (ROBAXIN) tablet 500 mg (has no administration in time range)    Or  methocarbamol (ROBAXIN) 500 mg in dextrose 5 % 50 mL IVPB (has no administration in time range)  senna-docusate (Senokot-S) tablet 1 tablet (has no administration in time range)  bisacodyl (DULCOLAX) suppository 10 mg (has no administration in time range)  magnesium citrate solution 1 Bottle (has no administration in time range)  iron polysaccharides (NIFEREX) capsule 150 mg (150 mg Oral Given 03/28/20 1310)  heparin injection 5,000 Units (5,000 Units Subcutaneous Given 03/26/20 2047)  potassium chloride 10 mEq in 100 mL IVPB (0 mEq Intravenous Stopped 03/26/20 0102)  chlorhexidine (HIBICLENS) 4 % liquid 4  application (4 application Topical Given 03/27/20 0800)  ceFAZolin (ANCEF) IVPB 2g/100 mL premix (2 g Intravenous Given 03/27/20 1230)  lactated ringers infusion (1,000 mLs Intravenous New Bag/Given 03/27/20 1144)  chlorhexidine (PERIDEX) 0.12 % solution 15 mL (15 mLs Mouth/Throat Given 03/27/20 1145)    Or  MEDLINE mouth rinse ( Mouth Rinse See Alternative 03/27/20 1145)  ceFAZolin (ANCEF) IVPB 2g/100 mL premix (2 g Intravenous New Bag/Given 03/28/20 0020)    Note:  This document was prepared using Dragon voice recognition software and may include unintentional dictation errors.  Alona Bene, MD, [facility] Emergency Medicine    Makesha Belitz, Arlyss Repress, MD 03/28/20 [id number]

## 2020-03-25 NOTE — ED Notes (Signed)
X-Ray at bedside.

## 2020-03-25 NOTE — ED Notes (Signed)
Hospitalist at bedside 

## 2020-03-25 NOTE — H&P (Signed)
TRH H&P    Patient Demographics:    Susan Schmidt, is a 74 y.o. female  MRN: [id number]  DOB - 12-27-1945  Admit Date - 03/25/2020  Referring MD/NP/PA: Dr. Laverta Baltimore  Outpatient Primary MD for the patient is Dorothyann Peng, NP  Patient coming from: Home  Chief complaint- Hip pain   HPI:    Susan Schmidt  is a 74 y.o. female, with history of osteoporosis, hyperlipidemia, diabetes mellitus, presents to the ED with a chief complaint of hip pain status post fall.  Patient was in her garden taking Bell peppers when she bent over, lost her balance, and fell down to her left side.  She landed on her left elbow and left hip.  Patient had immediate pain in her left hip and could not get up on her own.  Patient reports that she had no preceding symptoms to her fall, including no nausea vomiting, dizziness, chest pain, shortness of breath, palpitations, change in vision, change in hearing.  Patient reports that she did have palpitations and nausea when she was given morphine in the ED, but none prior to her fall.  In the recent days she has been in her normal state of health.  Patient has no complaints aside from her hip pain.  She reports that her hip pain is sharp, severe, worse with any movement of her hip, better with rest.  She has no associated numbness.  In the ED Dr. Laverta Baltimore spoke with Dr. Kenton Kingfisher who reported they will see patient tomorrow.  Patient may or may not be able to be worked into the surgical schedule tomorrow.  Patient will be n.p.o. after midnight.  Sisters at bedside and was given the opportunity to ask questions.  In the ED Temperature 97.9, pulse 79, blood pressure 155/64, satting at 98% on room air Leukocytosis of 16 Hypokalemia at 3.4 Hyperglycemia at 184 Covid test pending -patient is fully vaccinated for Covid Chest x-ray shows no acute intrathoracic process Patient is on a baby aspirin     Review of systems:    In addition to the HPI above,  No Fever-chills, No Headache, No changes with Vision or hearing, No problems swallowing food or Liquids, No Chest pain, Cough or Shortness of Breath, No Abdominal pain, No Nausea or Vomiting, bowel movements are regular, No Blood in stool or Urine, No dysuria, No new skin rashes or bruises,  No new weakness, tingling, numbness in any extremity, No recent weight gain or loss, No polyuria, polydypsia or polyphagia, No significant Mental Stressors.  All other systems reviewed and are negative.    Past History of the following :    Past Medical History:  Diagnosis Date  . DM (diabetes mellitus), type 2, uncontrolled ([place])   . Hyperlipidemia   . Osteoporosis       Past Surgical History:  Procedure Laterality Date  . ANKLE SURGERY Right   . COLONOSCOPY N/A 07/03/2019   Procedure: COLONOSCOPY;  Surgeon: Rogene Houston, MD;  [facility];  Service: Endoscopy;  Laterality: N/A;  830  .  pilonidal cyst        Social History:      Social History   Tobacco Use  . Smoking status: Former Smoker    Packs/day: 2.00    Types: Cigarettes    Start date: 07/11/1964    Quit date: 07/11/1976    Years since quitting: 43.7  . Smokeless tobacco: Never Used  Substance Use Topics  . Alcohol use: No       Family History :     Family History  Problem Relation Age of Onset  . Diabetes Mother   . Hypertension Mother   . Hypertension Father   . Heart attack Father   . Hypertension Sister   . Diabetes Brother   . Diabetes Sister       Home Medications:   Prior to Admission medications   Medication Sig Start Date End Date Taking? Authorizing Provider  aspirin EC 81 MG tablet Take 81 mg daily by mouth.    [provider]  Biotin 10000 MCG TABS Take 1 tablet daily by mouth.    [provider]  Blood Glucose Monitoring Suppl (ONE TOUCH ULTRA 2) w/Device KIT Test blood sugar twice daily 03/14/19    Nafziger, Tommi Rumps, NP  Calcium Carbonate-Vit D-Min (CALCIUM-VITAMIN D-MINERALS) 600-800 MG-UNIT CHEW Chew 1 tablet daily by mouth.    [provider]  Cholecalciferol (VITAMIN D) 50 MCG (2000 UT) tablet Take 2,000 Units by mouth daily.    [provider]  GLUCOSAMINE-CHONDROITIN PO Take 1 tablet by mouth daily. 1500 mg glucosamine and 1200 mg of choindroitan     [provider]  glucose blood (ONE TOUCH ULTRA TEST) test strip Test blood sugar twice daily 03/17/20   Dorothyann Peng, NP  Lancets [facility] ULTRASOFT) lancets Use as instructed 03/17/20   Dorothyann Peng, NP  Multiple Vitamins-Minerals (MULTIVITAMIN WOMEN 50+) TABS Take 1 tablet daily by mouth.    [provider]  Omega-3 Fatty Acids (SUPER OMEGA 3 EPA/DHA PO) Take 660 mg by mouth daily.    [provider]  rosuvastatin (CRESTOR) 5 MG tablet Take 1 tablet (5 mg total) by mouth daily. 03/18/20   Nafziger, Tommi Rumps, NP  vitamin E 400 UNIT capsule Take 400 Units daily by mouth.    [provider]     Allergies:    No Known Allergies   Physical Exam:   Vitals  Blood pressure (!) 143/87, pulse 79, temperature 97.9 F (36.6 C), temperature source Oral, resp. rate 17, height '5\' 3"'  (1.6 m), weight 50.3 kg, SpO2 100 %.  1.  General: Lying supine in bed in no acute distress  2. Psychiatric: Pleasant, cooperative, mood and behavior normal for situation  3. Neurologic: Cranial nerves II through XII are grossly intact, sensation is equal in the upper and lower extremities bilaterally  4. HEENMT:  Head is atraumatic, normocephalic, pupils are reactive to light, neck is supple, trachea is midline  5. Respiratory : Lungs are clear to auscultation bilaterally  6. Cardiovascular : Heart rate and rhythm are regular No peripheral edema  7. Gastrointestinal:  Abdomen is soft, nondistended, nontender to palpation  8. Skin:  Small skin tear on left elbow No other acute lesions on limited skin  exam  9.Musculoskeletal:  Left leg is deformity of proximal femur, is externally rotated and flexed    Data Review:    CBC Recent Labs  Lab 03/25/20 2010  WBC 16.0*  HGB 12.3  HCT 35.9*  PLT 180  MCV 93.0  MCH  31.9  MCHC 34.3  RDW 12.2  LYMPHSABS 1.8  MONOABS 0.8  EOSABS 0.0  BASOSABS 0.0   ------------------------------------------------------------------------------------------------------------------  Results for orders placed or performed during the hospital encounter of 03/25/20 (from the past 48 hour(s))  Basic metabolic panel     Status: Abnormal   Collection Time: 03/25/20  8:10 PM  Result Value Ref Range   Sodium 132 (L) 135 - 145 mmol/L   Potassium 3.4 (L) 3.5 - 5.1 mmol/L   Chloride 98 98 - 111 mmol/L   CO2 24 22 - 32 mmol/L   Glucose, Bld 184 (H) 70 - 99 mg/dL    Comment: Glucose reference range applies only to samples taken after fasting for at least 8 hours.   BUN 11 8 - 23 mg/dL   Creatinine, Ser 0.59 0.44 - 1.00 mg/dL   Calcium 8.8 (L) 8.9 - 10.3 mg/dL   GFR calc non Af Amer >60 >60 mL/min   GFR calc Af Amer >60 >60 mL/min   Anion gap 10 5 - 15    Comment: Performed at [facility], [address], [place], [place] [address]  CBC with Differential     Status: Abnormal   Collection Time: 03/25/20  8:10 PM  Result Value Ref Range   WBC 16.0 (H) 4.0 - 10.5 K/uL   RBC 3.86 (L) 3.87 - 5.11 MIL/uL   Hemoglobin 12.3 12.0 - 15.0 g/dL   HCT 35.9 (L) 36 - 46 %   MCV 93.0 80.0 - 100.0 fL   MCH 31.9 26.0 - 34.0 pg   MCHC 34.3 30.0 - 36.0 g/dL   RDW 12.2 11.5 - 15.5 %   Platelets 180 150 - 400 K/uL   nRBC 0.0 0.0 - 0.2 %   Neutrophils Relative % 83 %   Neutro Abs 13.3 (H) 1.7 - 7.7 K/uL   Lymphocytes Relative 11 %   Lymphs Abs 1.8 0.7 - 4.0 K/uL   Monocytes Relative 5 %   Monocytes Absolute 0.8 0 - 1 K/uL   Eosinophils Relative 0 %   Eosinophils Absolute 0.0 0 - 0 K/uL   Basophils Relative 0 %   Basophils Absolute 0.0 0 - 0 K/uL   Immature  Granulocytes 1 %   Abs Immature Granulocytes 0.12 (H) 0.00 - 0.07 K/uL    Comment: Performed at [facility], [address], [place], [place] [address]  Protime-INR     Status: None   Collection Time: 03/25/20  8:10 PM  Result Value Ref Range   Prothrombin Time 13.4 11.4 - 15.2 seconds   INR 1.1 0.8 - 1.2    Comment: (NOTE) INR goal varies based on device and disease states. Performed at [facility], [address] [place], [place] [address]   SARS Coronavirus 2 by RT PCR (hospital order, performed in [facility] hospital lab) Nasopharyngeal Nasopharyngeal Swab     Status: None   Collection Time: 03/25/20  8:11 PM   Specimen: Nasopharyngeal Swab  Result Value Ref Range   SARS Coronavirus 2 NEGATIVE NEGATIVE    Comment: (NOTE) SARS-CoV-2 target nucleic acids are NOT DETECTED.  The SARS-CoV-2 RNA is generally detectable in upper and lower respiratory specimens during the acute phase of infection. The lowest concentration of SARS-CoV-2 viral copies this assay can detect is 250 copies / mL. A negative result does not preclude SARS-CoV-2 infection and should not be used as the sole basis for treatment or other patient management decisions.  A negative result may occur with  improper specimen collection / handling, submission of specimen other than nasopharyngeal swab, presence of viral mutation(s) within the areas targeted by this assay, and inadequate number of viral copies (<250 copies / mL). A negative result must be combined with clinical observations, patient history, and epidemiological information.  Fact Sheet for Patients:   StrictlyIdeas.no  Fact Sheet for Healthcare Providers: BankingDealers.co.za  This test is not yet approved or  cleared by the [place] FDA and has been authorized for detection and/or diagnosis of SARS-CoV-2 by FDA under an Emergency Use Authorization (EUA).  This EUA will remain in effect (meaning  this test can be used) for the duration of the COVID-19 declaration under Section 564(b)(1) of the Act, 21 U.S.C. section 360bbb-3(b)(1), unless the authorization is terminated or revoked sooner.  Performed at [facility], [address] [place], [place] [address]   Type and screen [facility]     Status: None   Collection Time: 03/25/20  8:12 PM  Result Value Ref Range   ABO/RH(D) A POS    Antibody Screen NEG    Sample Expiration      03/28/2020,2359 Performed at [facility], [address] [place], [place] [address]   ABO/Rh     Status: None   Collection Time: 03/25/20  9:09 PM  Result Value Ref Range   ABO/RH(D)      A POS Performed at [facility], [address] [place],  [address]     Chemistries  Recent Labs  Lab 03/25/20 2010  NA 132*  K 3.4*  CL 98  CO2 24  GLUCOSE 184*  BUN 11  CREATININE 0.59  CALCIUM 8.8*   ------------------------------------------------------------------------------------------------------------------  ------------------------------------------------------------------------------------------------------------------ GFR: Estimated Creatinine Clearance: 48.2 mL/min (by C-G formula based on SCr of 0.59 mg/dL). Liver Function Tests: No results for input(s): AST, ALT, ALKPHOS, BILITOT, PROT, ALBUMIN in the last 168 hours. No results for input(s): LIPASE, AMYLASE in the last 168 hours. No results for input(s): AMMONIA in the last 168 hours. Coagulation Profile: Recent Labs  Lab 03/25/20 2010  INR 1.1   Cardiac Enzymes: No results for input(s): CKTOTAL, CKMB, CKMBINDEX, TROPONINI in the last 168 hours. BNP (last 3 results) No results for input(s): PROBNP in the last 8760 hours. HbA1C: No results for input(s): HGBA1C in the last 72 hours. CBG: No results for input(s): GLUCAP in the last 168 hours. Lipid Profile: No results for input(s): CHOL, HDL, LDLCALC, TRIG, CHOLHDL, LDLDIRECT in the last 72 hours. Thyroid  Function Tests: No results for input(s): TSH, T4TOTAL, FREET4, T3FREE, THYROIDAB in the last 72 hours. Anemia Panel: No results for input(s): VITAMINB12, FOLATE, FERRITIN, TIBC, IRON, RETICCTPCT in the last 72 hours.  --------------------------------------------------------------------------------------------------------------- Urine analysis: No results found for: COLORURINE, APPEARANCEUR, LABSPEC, PHURINE, GLUCOSEU, HGBUR, BILIRUBINUR, KETONESUR, PROTEINUR, UROBILINOGEN, NITRITE, LEUKOCYTESUR    Imaging Results:    DG Chest Portable 1 View  Result Date: 03/25/2020 CLINICAL DATA:  [place], left hip fracture, preoperative evaluation, hypertension and diabetes, tobacco abuse EXAM: PORTABLE CHEST 1 VIEW COMPARISON:  None. FINDINGS: Single frontal view of the chest demonstrates an unremarkable cardiac silhouette. No airspace disease, effusion, or pneumothorax. There are no acute displaced fractures. IMPRESSION: 1. No acute intrathoracic process. Electronically Signed   By: Randa Ngo M.D.   On: 03/25/2020 20:50   DG Hip Unilat W or Wo Pelvis 2-3 Views Left  Result Date: 03/25/2020 CLINICAL DATA:  74 year old female status post fall with left hip pain. EXAM: DG HIP (WITH OR WITHOUT PELVIS) 2-3V LEFT  COMPARISON:  None. FINDINGS: Highly comminuted and moderately impacted left femur intertrochanteric fracture. Displaced butterfly fragments. Left femoral head remains normally located. The pelvis appears to remain intact. Grossly intact proximal right femur. Negative lower abdominal and pelvic visceral contours. IMPRESSION: Highly comminuted left femur intertrochanteric fracture with varus impaction and displaced butterfly fragments. Electronically Signed   By: Genevie Ann M.D.   On: 03/25/2020 20:14       Assessment & Plan:    Active Problems:   Closed left hip fracture ([place])   1. Left intertrochanteric hip fracture 1. Mechanical fall 2. Xray shows highly comminuted intertrochanteric  fracture 3. Dr. Kenton Kingfisher consulted from the ED and reports that patient will be seen tomorrow 4. Revised cardiac risk index for preoperative risk = 3.9 % 30-day risk of death, MI, or cardiac arrest 5. Blood type done in the ED 6. EKG pending 7. UA pending 8. N.p.o. after midnight 9. Pain control with pain scale 10. Hold aspirin 11. PT consulted 12. Ortho consulted 13. Continue to monitor 2. Hypokalemia 1. Potassium 3.4 2. 20 mEq given in ED 3. Trend in the a.m. 3. DMII 1. Diet controlled 2. Last hemoglobin A1c was 5.7 3. Every 8 hour CBG 4. Sliding scale coverage 4. HLD 1. Continue rosuvastatin 5. Leukocytosis 1. Acute phase reactant 6.    DVT Prophylaxis-   Heparin - SCDs   AM Labs Ordered, also please review Full Orders  Family Communication: Admission, patients condition and plan of care including tests being ordered have been discussed with the patient and sister who indicate understanding and agree with the plan and Code Status.  Code Status:  Full  Admission status: Inpatient :The appropriate admission status for this patient is INPATIENT. Inpatient status is judged to be reasonable and necessary in order to provide the required intensity of service to ensure the patient's safety. The patient's presenting symptoms, physical exam findings, and initial radiographic and laboratory data in the context of their chronic comorbidities is felt to place them at high risk for further clinical deterioration. Furthermore, it is not anticipated that the patient will be medically stable for discharge from the hospital within 2 midnights of admission. The following factors support the admission status of inpatient.     The patient's presenting symptoms include Fall and hip pain The worrisome physical exam findings include Left leg with deformity at proximal thigh, and frog leg positioning The initial radiographic and laboratory data are worrisome because of intertrochanteric  fracture The chronic co-morbidities include HLD, DMII       I certify that at the point of admission it is my clinical judgment that the patient will require inpatient hospital care spanning beyond 2 midnights from the point of admission due to high intensity of service, high risk for further deterioration and high frequency of surveillance required.  Time spent in minutes : [place]

## 2020-03-26 DIAGNOSIS — E785 Hyperlipidemia, unspecified: Secondary | ICD-10-CM

## 2020-03-26 DIAGNOSIS — E1169 Type 2 diabetes mellitus with other specified complication: Secondary | ICD-10-CM

## 2020-03-26 DIAGNOSIS — Y92017 Garden or yard in single-family (private) house as the place of occurrence of the external cause: Secondary | ICD-10-CM

## 2020-03-26 LAB — COMPREHENSIVE METABOLIC PANEL
ALT: 14 U/L (ref 0–44)
AST: 20 U/L (ref 15–41)
Albumin: 3.2 g/dL — ABNORMAL LOW (ref 3.5–5.0)
Alkaline Phosphatase: 38 U/L (ref 38–126)
Anion gap: 6 (ref 5–15)
BUN: 10 mg/dL (ref 8–23)
CO2: 26 mmol/L (ref 22–32)
Calcium: 8.7 mg/dL — ABNORMAL LOW (ref 8.9–10.3)
Chloride: 98 mmol/L (ref 98–111)
Creatinine, Ser: 0.57 mg/dL (ref 0.44–1.00)
GFR calc Af Amer: >60 mL/min (ref >60)
GFR calc non Af Amer: >60 mL/min (ref >60)
Glucose, Bld: 192 mg/dL — ABNORMAL HIGH (ref 70–99)
Potassium: 4.2 mmol/L (ref 3.5–5.1)
Sodium: 130 mmol/L — ABNORMAL LOW (ref 135–145)
Total Bilirubin: 0.7 mg/dL (ref 0.3–1.2)
Total Protein: 5.9 g/dL — ABNORMAL LOW (ref 6.5–8.1)

## 2020-03-26 LAB — MAGNESIUM: Magnesium: 1.7 mg/dL (ref 1.7–2.4)

## 2020-03-26 LAB — CBG MONITORING, ED
Glucose-Capillary: 173 mg/dL — ABNORMAL HIGH (ref 70–99)
Glucose-Capillary: 150 mg/dL — ABNORMAL HIGH (ref 70–99)
Glucose-Capillary: 155 mg/dL — ABNORMAL HIGH (ref 70–99)
Glucose-Capillary: 147 mg/dL — ABNORMAL HIGH (ref 70–99)

## 2020-03-26 LAB — CBC
HCT: 30.8 % — ABNORMAL LOW (ref 36.0–46.0)
Hemoglobin: 10.4 g/dL — ABNORMAL LOW (ref 12.0–15.0)
MCH: 31.7 pg (ref 26.0–34.0)
MCHC: 33.8 g/dL (ref 30.0–36.0)
MCV: 93.9 fL (ref 80.0–100.0)
Platelets: 172 10*3/uL (ref 150–400)
RBC: 3.28 MIL/uL — ABNORMAL LOW (ref 3.87–5.11)
RDW: 12.1 % (ref 11.5–15.5)
WBC: 13.3 10*3/uL — ABNORMAL HIGH (ref 4.0–10.5)
nRBC: 0 % (ref 0.0–0.2)

## 2020-03-26 LAB — SURGICAL PCR SCREEN
MRSA, PCR: NEGATIVE
Staphylococcus aureus: NEGATIVE

## 2020-03-26 LAB — VITAMIN B12: Vitamin B-12: 585 pg/mL (ref 180–914)

## 2020-03-26 MED ORDER — MUPIROCIN 2 % EX OINT
1.0000 "application " | TOPICAL_OINTMENT | Freq: Two times a day (BID) | CUTANEOUS | Status: DC
  Administered 2020-03-27 (×2): 1 via NASAL
  Filled 2020-03-26: qty 22

## 2020-03-26 MED ORDER — CHLORHEXIDINE GLUCONATE CLOTH 2 % EX PADS
6.0000 | MEDICATED_PAD | Freq: Every day | CUTANEOUS | Status: DC
  Administered 2020-03-27 – 2020-03-30 (×5): 6 via TOPICAL

## 2020-03-26 NOTE — Consult Note (Signed)
HOSPITAL CONSULT  [facility]   Patient ID: Susan Schmidt, female   DOB: 10/12/1945, 74 y.o.   MRN: [id number]  New patient  Requested by:  Reason for:   Based on the information below I recommend internal fixation left hip   Chief Complaint  Patient presents with  . Fall     HPI  Fell in the yard  74-year-old female in reasonable health fell in the yard on 25 March 2020 complains of pain located over the left hip nonradiating severe worsened by movement relieved by rest Patient can no longer weight-bear Review of Systems (all) ROS All systems were reviewed and were negative except for musculoskeletal related to pain decreased range of motion left hip Past Medical History:  Diagnosis Date  . DM (diabetes mellitus), type 2, uncontrolled ([facility])   . Hyperlipidemia   . Osteoporosis     Past Surgical History:  Procedure Laterality Date  . ANKLE SURGERY Right   . COLONOSCOPY N/A 07/03/2019   Procedure: COLONOSCOPY;  Surgeon: Rehman, Najeeb U, MD;  [facility];  Service: Endoscopy;  Laterality: N/A;  830  . pilonidal cyst      Family History  Problem Relation Age of Onset  . Diabetes Mother   . Hypertension Mother   . Hypertension Father   . Heart attack Father   . Hypertension Sister   . Diabetes Brother   . Diabetes Sister    Social History   Tobacco Use  . Smoking status: Former Smoker    Packs/day: 2.00    Types: Cigarettes    Start date: 07/11/1964    Quit date: 07/11/1976    Years since quitting: 43.7  . Smokeless tobacco: Never Used  Vaping Use  . Vaping Use: Never used  Substance Use Topics  . Alcohol use: No  . Drug use: Never   No Known Allergies  Current Facility-Administered Medications:  .  acetaminophen (TYLENOL) tablet 650 mg, 650 mg, Oral, Q6H PRN **OR** acetaminophen (TYLENOL) suppository 650 mg, 650 mg, Rectal, Q6H PRN, Zierle-Ghosh, Asia B, DO .  calcium-vitamin D (OSCAL WITH D) 500-200 MG-UNIT per tablet  1 tablet, 1 tablet, Oral, Daily, Zierle-Ghosh, Asia B, DO .  heparin injection 5,000 Units, 5,000 Units, Subcutaneous, Q8H, Zierle-Ghosh, Asia B, DO, 5,000 Units at 03/26/20 0616 .  insulin aspart (novoLOG) injection 0-9 Units, 0-9 Units, Subcutaneous, TID WC, Zierle-Ghosh, Asia B, DO .  lactated ringers infusion, , Intravenous, Continuous, Zierle-Ghosh, Asia B, DO, Last Rate: 100 mL/hr at 03/26/20 0728, New Bag at 03/26/20 0728 .  morphine 4 MG/ML injection 4 mg, 4 mg, Intravenous, Q1H PRN, Zierle-Ghosh, Asia B, DO .  ondansetron (ZOFRAN) injection 4 mg, 4 mg, Intravenous, Q8H PRN, Zierle-Ghosh, Asia B, DO .  oxyCODONE (Oxy IR/ROXICODONE) immediate release tablet 5 mg, 5 mg, Oral, Q4H PRN, Zierle-Ghosh, Asia B, DO, 5 mg at 03/25/20 2135 .  polyethylene glycol (MIRALAX / GLYCOLAX) packet 17 g, 17 g, Oral, Daily PRN, Zierle-Ghosh, Asia B, DO .  rosuvastatin (CRESTOR) tablet 5 mg, 5 mg, Oral, Daily, Zierle-Ghosh, Asia B, DO  Current Outpatient Medications:  .  aspirin EC 81 MG tablet, Take 81 mg daily by mouth., Disp: , Rfl:  .  Biotin 10000 MCG TABS, Take 1 tablet daily by mouth., Disp: , Rfl:  .  Calcium Carbonate-Vit D-Min (CALCIUM-VITAMIN D-MINERALS) 600-800 MG-UNIT CHEW, Chew 1 tablet daily by mouth., Disp: , Rfl:  .  Cholecalciferol (VITAMIN D) 50 MCG (2000 UT) tablet, Take 2,000 Units by   mouth daily., Disp: , Rfl:  .  GLUCOSAMINE-CHONDROITIN PO, Take 1 tablet by mouth daily. 1500 mg glucosamine and 1200 mg of choindroitan , Disp: , Rfl:  .  Multiple Vitamins-Minerals (MULTIVITAMIN WOMEN 50+) TABS, Take 1 tablet daily by mouth., Disp: , Rfl:  .  Omega-3 Fatty Acids (SUPER OMEGA 3 EPA/DHA PO), Take 660 mg by mouth daily., Disp: , Rfl:  .  rosuvastatin (CRESTOR) 5 MG tablet, Take 1 tablet (5 mg total) by mouth daily., Disp: 90 tablet, Rfl: 1 .  vitamin E 400 UNIT capsule, Take 400 Units daily by mouth., Disp: , Rfl:  .  Blood Glucose Monitoring Suppl (ONE TOUCH ULTRA 2) w/Device KIT, Test blood  sugar twice daily, Disp: 1 kit, Rfl: 0 .  glucose blood (ONE TOUCH ULTRA TEST) test strip, Test blood sugar twice daily, Disp: 100 each, Rfl: 12 .  Lancets (ONETOUCH ULTRASOFT) lancets, Use as instructed, Disp: 100 each, Rfl: 12    Physical Exam(=30) BP (!) 108/55   Pulse 73   Temp 97.9 F (36.6 C) (Oral)   Resp 15   Ht 5' 3" (1.6 m)   Wt 50.3 kg   SpO2 100%   BMI 19.66 kg/m   Gen. Appearance normal appearance grooming and hygiene Peripheral vascular system normal temperature color capillary refill Lymph nodes ARE NORMAL  Gait not able to walk  Left Upper extremity  Inspection revealed no malalignment or asymmetry  Assessment of range of motion: Full range of motion was recorded  Assessment of stability: Elbow wrist and hand and shoulder were stable  Assessment of muscle strength and tone revealed grade 5 muscle strength and normal muscle tone  Skin was normal without rash lesion or ulceration  Right upper extremity  Inspection revealed no malalignment or asymmetry  Assessment of range of motion: Full range of motion was recorded  Assessment of stability: Elbow wrist and hand and shoulder were stable  Assessment of muscle strength and tone revealed grade 5 muscle strength and normal muscle tone  Skin was normal without rash lesion or ulceration  Right Lower extremity  Inspection revealed no malalignment or asymmetry  Assessment of range of motion: Full range of motion was recorded  Assessment of stability: Ankle, knee and hip were stable  Assessment of muscle strength and tone revealed grade 5 muscle strength and normal muscle tone  Skin was normal without rash lesion or ulceration  Left lower extremity Skin normal Tenderness proximal thigh Range of motion unable to test No stability tests were done because of pain Muscle tone was normal no tremor   Coordination was tested by finger-to-nose nose and was normal Deep tendon reflexes were 2+ in the upper extremities   Examination of sensation by touch was normal  Mental status  Oriented to time person and place normal  Mood and affect normal without depression anxiety or agitation  Dx:   Data Reviewed  ER RECORD REVIEWED: CONFIRMS HISTORY   I reviewed the following images and the reports and my independent interpretation is comminuted intertrochanteric fracture looks like a three-part intact posterior medial buttress  This will require surgery  Assessment  CBC Latest Ref Rng & Units 03/26/2020 03/25/2020 03/17/2020  WBC 4.0 - 10.5 K/uL 13.3(H) 16.0(H) 5.5  Hemoglobin 12.0 - 15.0 g/dL 10.4(L) 12.3 14.7  Hematocrit 36 - 46 % 30.8(L) 35.9(L) 43.2  Platelets 150 - 400 K/uL 172 180 194   BMP Latest Ref Rng & Units 03/26/2020 03/25/2020 03/17/2020  Glucose 70 - 99 mg/dL 192(H)   184(H) 124(H)  BUN 8 - 23 mg/dL 10 11 9  Creatinine 0.44 - 1.00 mg/dL 0.57 0.59 0.69  BUN/Creat Ratio 6 - 22 (calc) - - NOT APPLICABLE  Sodium 135 - 145 mmol/L 130(L) 132(L) 139  Potassium 3.5 - 5.1 mmol/L 4.2 3.4(L) 4.0  Chloride 98 - 111 mmol/L 98 98 102  CO2 22 - 32 mmol/L 26 24 30  Calcium 8.9 - 10.3 mg/dL 8.7(L) 8.8(L) 10.1     Plan  Open treatment internal fixation left hip intramedullary gamma nail   Kmarion Rawl E Jaymen Fetch MD   

## 2020-03-26 NOTE — Progress Notes (Signed)
PROGRESS NOTE    Susan Schmidt  ZOX:[id number] DOB: [birth year] DOA: 03/25/2020 PCP: Shirline Frees, NP    Chief Complaint  Patient presents with   Fall    Brief Narrative:  As per H&P written by Dr. Carren Rang on 03/25/20 Susan Schmidt  is a 74 y.o. female, with history of osteoporosis, hyperlipidemia, diabetes mellitus, presents to the ED with a chief complaint of hip pain status post fall.  Patient was in her garden taking Bell peppers when she bent over, lost her balance, and fell down to her left side.  She landed on her left elbow and left hip.  Patient had immediate pain in her left hip and could not get up on her own.  Patient reports that she had no preceding symptoms to her fall, including no nausea vomiting, dizziness, chest pain, shortness of breath, palpitations, change in vision, change in hearing.  Patient reports that she did have palpitations and nausea when she was given morphine in the ED, but none prior to her fall.  In the recent days she has been in her normal state of health.  Patient has no complaints aside from her hip pain.  She reports that her hip pain is sharp, severe, worse with any movement of her hip, better with rest.  She has no associated numbness.  In the ED Dr. Jacqulyn Bath spoke with Dr. Tiburcio Pea who reported they will see patient tomorrow.  Patient may or may not be able to be worked into the surgical schedule tomorrow.  Patient will be n.p.o. after midnight.  Sisters at bedside and was given the opportunity to ask questions.  In the ED Temperature 97.9, pulse 79, blood pressure 155/64, satting at 98% on room air Leukocytosis of 16 Hypokalemia at 3.4 Hyperglycemia at 184 Covid test pending -patient is fully vaccinated for Covid Chest x-ray shows no acute intrathoracic process Patient is on a baby aspirin  Assessment & Plan: 1-Displaced Closed left hip fracture (HCC) -After mechanical fall -Revised cardiac risk index for preoperative risk is  3.9% -Continue to follow hip fracture protocol -As needed analgesics and muscle relaxant -Orthopedic surgery has seen patient and planning for surgical intervention on 03/27/2020 -Patient will be n.p.o. after midnight. -Stable vitamin D level and normal TSH. -Will check B12 level. -Physical therapy/Occupational Therapy to work with patient after surgical intervention.  2-acute urinary retention -Foley catheter has been placed -Assess voiding trials after surgical repair completed.  3-hypokalemia -Repleted -Follow electrolytes trend and further replete as needed.  4-type 2 diabetes mellitus -Diet control as an outpatient -Most recent A1c 5.7 -Some elevation in her CBGs appreciated at time of admission most likely as a acute phase reactant from demargination stress. -Will use a sliding scale insulin while inpatient.  5-hyperlipidemia  -continue statins   DVT prophylaxis: SCDs Code Status: Full code Family Communication: No family at bedside.  Patient expressed that she will come to family member with update. 03/26/20 Disposition:   Status is: Inpatient  Dispo: The patient is from: Home              Anticipated d/c is to: To be determined              Anticipated d/c date is: 2-3 days; even longer if nursing facility placement require for rehabilitation after surgical intervention.              Patient currently no medically stable for discharge; still having ongoing pain and needing surgical intervention to repair left hip fracture.  Follow orthopedic service recommendations; continue IV analgesics and supportive care.      Consultants:   Dr. Romeo Apple (orthopedic service.   Procedures:  -See below for x-ray reports -Left hip surgical intervention planned for 03/27/2020.   Antimicrobials: -None   Subjective: No fever, no chest pain, no nausea, no vomiting, no focal neurologic deficits.  Patient reporting pain with any movement of her left hip.  Objective: Vitals:    03/26/20 0600 03/26/20 0800 03/26/20 0900 03/26/20 1000  BP: (!) 108/55 (!) 121/54 122/71 105/90  Pulse: 73 72 72 79  Resp: 15 14 17  (!) 24  Temp:      TempSrc:      SpO2: 100% 99% 100% 100%  Weight:      Height:        Intake/Output Summary (Last 24 hours) at 03/26/2020 1035 Last data filed at 03/26/2020 0727 Gross per 24 hour  Intake 1180 ml  Output --  Net 1180 ml   Filed Weights   03/25/20 1938  Weight: 50.3 kg    Examination:  General exam: Appears calm and comfortable while resting; still having significant pain on her left hip with any type of movement.  No fever, no chest pain, no nausea, no vomiting. Respiratory system: Clear to auscultation. Respiratory effort normal. Cardiovascular system: S1 & S2 heard, RRR. No JVD, murmurs, rubs, gallops or clicks. No pedal edema. Gastrointestinal system: Abdomen is nondistended, soft and nontender. No organomegaly or masses felt. Normal bowel sounds heard. Central nervous system: Alert and oriented. No focal neurological deficits. Extremities: No cyanosis or clubbing, no edema.  Left lower extremity shorter and externally rotated in comparison to the right.  Voluntary decreased range of motion secondary to pain. Skin: No rashes, no petechiae. Psychiatry: Judgement and insight appear normal. Mood & affect appropriate.    Data Reviewed: I have personally reviewed following labs and imaging studies  CBC: Recent Labs  Lab 03/25/20 2010 03/26/20 0541  WBC 16.0* 13.3*  NEUTROABS 13.3*  --   HGB 12.3 10.4*  HCT 35.9* 30.8*  MCV 93.0 93.9  PLT 180 172    Basic Metabolic Panel: Recent Labs  Lab 03/25/20 2010 03/26/20 0541  NA 132* 130*  K 3.4* 4.2  CL 98 98  CO2 24 26  GLUCOSE 184* 192*  BUN 11 10  CREATININE 0.59 0.57  CALCIUM 8.8* 8.7*  MG  --  1.7    GFR: Estimated Creatinine Clearance: 48.2 mL/min (by C-G formula based on SCr of 0.57 mg/dL).  Liver Function Tests: Recent Labs  Lab 03/26/20 0541  AST  20  ALT 14  ALKPHOS 38  BILITOT 0.7  PROT 5.9*  ALBUMIN 3.2*    CBG: Recent Labs  Lab 03/26/20 0455 03/26/20 0829  GLUCAP 173* 150*     Recent Results (from the past 240 hour(s))  SARS Coronavirus 2 by RT PCR (hospital order, performed in [facility] hospital lab) Nasopharyngeal Nasopharyngeal Swab     Status: None   Collection Time: 03/25/20  8:11 PM   Specimen: Nasopharyngeal Swab  Result Value Ref Range Status   SARS Coronavirus 2 NEGATIVE NEGATIVE Final    Comment: (NOTE) SARS-CoV-2 target nucleic acids are NOT DETECTED.  The SARS-CoV-2 RNA is generally detectable in upper and lower respiratory specimens during the acute phase of infection. The lowest concentration of SARS-CoV-2 viral copies this assay can detect is 250 copies / mL. A negative result does not preclude SARS-CoV-2 infection and should not be used as the sole  basis for treatment or other patient management decisions.  A negative result may occur with improper specimen collection / handling, submission of specimen other than nasopharyngeal swab, presence of viral mutation(s) within the areas targeted by this assay, and inadequate number of viral copies (<250 copies / mL). A negative result must be combined with clinical observations, patient history, and epidemiological information.  Fact Sheet for Patients:   BoilerBrush.com.cy  Fact Sheet for Healthcare Providers: https://pope.com/  This test is not yet approved or  cleared by the [place] FDA and has been authorized for detection and/or diagnosis of SARS-CoV-2 by FDA under an Emergency Use Authorization (EUA).  This EUA will remain in effect (meaning this test can be used) for the duration of the COVID-19 declaration under Section 564(b)(1) of the Act, 21 U.S.C. section 360bbb-3(b)(1), unless the authorization is terminated or revoked sooner.  Performed at [facility], [address]  [place], [place] [address]      Radiology Studies: DG Chest Portable 1 View  Result Date: 03/25/2020 CLINICAL DATA:  Larey Seat, left hip fracture, preoperative evaluation, hypertension and diabetes, tobacco abuse EXAM: PORTABLE CHEST 1 VIEW COMPARISON:  None. FINDINGS: Single frontal view of the chest demonstrates an unremarkable cardiac silhouette. No airspace disease, effusion, or pneumothorax. There are no acute displaced fractures. IMPRESSION: 1. No acute intrathoracic process. Electronically Signed   By: Sharlet Salina M.D.   On: 03/25/2020 20:50   DG Hip Unilat W or Wo Pelvis 2-3 Views Left  Result Date: 03/25/2020 CLINICAL DATA:  74 year old female status post fall with left hip pain. EXAM: DG HIP (WITH OR WITHOUT PELVIS) 2-3V LEFT COMPARISON:  None. FINDINGS: Highly comminuted and moderately impacted left femur intertrochanteric fracture. Displaced butterfly fragments. Left femoral head remains normally located. The pelvis appears to remain intact. Grossly intact proximal right femur. Negative lower abdominal and pelvic visceral contours. IMPRESSION: Highly comminuted left femur intertrochanteric fracture with varus impaction and displaced butterfly fragments. Electronically Signed   By: Odessa Fleming M.D.   On: 03/25/2020 20:14    Scheduled Meds:  calcium-vitamin D  1 tablet Oral Daily   heparin  5,000 Units Subcutaneous Q8H   insulin aspart  0-9 Units Subcutaneous TID WC   rosuvastatin  5 mg Oral Daily   Continuous Infusions:  lactated ringers 100 mL/hr at 03/26/20 0728     LOS: 1 day    Time spent: 30 minutes    Vassie Loll, MD [facility]   To contact the attending provider between 7A-7P or the covering provider during after hours 7P-7A, please log into the web site [email or web address] and access using universal [place] password for that web site. If you do not have the password, please call the hospital operator.  03/26/2020, 10:35 AM

## 2020-03-26 NOTE — H&P (View-Only) (Signed)
[facility]   Patient ID: Susan Schmidt, female   DOB: 1946-01-28, 73 y.o.   MRN: [id number]  New patient  Requested by:  Reason for:   Based on the information below I recommend internal fixation left hip   Chief Complaint  Patient presents with  . Fall     HPI  [place] in the yard  74 year old female in reasonable health fell in the yard on 25 March 2020 complains of pain located over the left hip nonradiating severe worsened by movement relieved by rest Patient can no longer weight-bear Review of Systems (all) ROS All systems were reviewed and were negative except for musculoskeletal related to pain decreased range of motion left hip Past Medical History:  Diagnosis Date  . DM (diabetes mellitus), type 2, uncontrolled ([place])   . Hyperlipidemia   . Osteoporosis     Past Surgical History:  Procedure Laterality Date  . ANKLE SURGERY Right   . COLONOSCOPY N/A 07/03/2019   Procedure: COLONOSCOPY;  Surgeon: Rogene Houston, MD;  Location: [facility];  Service: Endoscopy;  Laterality: N/A;  830  . pilonidal cyst      Family History  Problem Relation Age of Onset  . Diabetes Mother   . Hypertension Mother   . Hypertension Father   . Heart attack Father   . Hypertension Sister   . Diabetes Brother   . Diabetes Sister    Social History   Tobacco Use  . Smoking status: Former Smoker    Packs/day: 2.00    Types: Cigarettes    Start date: 07/11/1964    Quit date: 07/11/1976    Years since quitting: 43.7  . Smokeless tobacco: Never Used  Vaping Use  . Vaping Use: Never used  Substance Use Topics  . Alcohol use: No  . Drug use: Never   No Known Allergies  Current Facility-Administered Medications:  .  acetaminophen (TYLENOL) tablet 650 mg, 650 mg, Oral, Q6H PRN **OR** acetaminophen (TYLENOL) suppository 650 mg, 650 mg, Rectal, Q6H PRN, Zierle-Ghosh, Asia B, DO .  calcium-vitamin D (OSCAL WITH D) 500-200 MG-UNIT per tablet  1 tablet, 1 tablet, Oral, Daily, Zierle-Ghosh, Asia B, DO .  heparin injection 5,000 Units, 5,000 Units, Subcutaneous, Q8H, Zierle-Ghosh, Asia B, DO, 5,000 Units at 03/26/20 0616 .  insulin aspart (novoLOG) injection 0-9 Units, 0-9 Units, Subcutaneous, TID WC, Zierle-Ghosh, Asia B, DO .  lactated ringers infusion, , Intravenous, Continuous, Zierle-Ghosh, Asia B, DO, Last Rate: 100 mL/hr at 03/26/20 0728, New Bag at 03/26/20 0728 .  morphine 4 MG/ML injection 4 mg, 4 mg, Intravenous, Q1H PRN, Zierle-Ghosh, Asia B, DO .  ondansetron (ZOFRAN) injection 4 mg, 4 mg, Intravenous, Q8H PRN, Zierle-Ghosh, Asia B, DO .  oxyCODONE (Oxy IR/ROXICODONE) immediate release tablet 5 mg, 5 mg, Oral, Q4H PRN, Zierle-Ghosh, Asia B, DO, 5 mg at 03/25/20 2135 .  polyethylene glycol (MIRALAX / GLYCOLAX) packet 17 g, 17 g, Oral, Daily PRN, Zierle-Ghosh, Asia B, DO .  rosuvastatin (CRESTOR) tablet 5 mg, 5 mg, Oral, Daily, Zierle-Ghosh, Asia B, DO  Current Outpatient Medications:  .  aspirin EC 81 MG tablet, Take 81 mg daily by mouth., Disp: , Rfl:  .  Biotin 10000 MCG TABS, Take 1 tablet daily by mouth., Disp: , Rfl:  .  Calcium Carbonate-Vit D-Min (CALCIUM-VITAMIN D-MINERALS) 600-800 MG-UNIT CHEW, Chew 1 tablet daily by mouth., Disp: , Rfl:  .  Cholecalciferol (VITAMIN D) 50 MCG (2000 UT) tablet, Take 2,000 Units by  mouth daily., Disp: , Rfl:  .  GLUCOSAMINE-CHONDROITIN PO, Take 1 tablet by mouth daily. 1500 mg glucosamine and 1200 mg of choindroitan , Disp: , Rfl:  .  Multiple Vitamins-Minerals (MULTIVITAMIN WOMEN 50+) TABS, Take 1 tablet daily by mouth., Disp: , Rfl:  .  Omega-3 Fatty Acids (SUPER OMEGA 3 EPA/DHA PO), Take 660 mg by mouth daily., Disp: , Rfl:  .  rosuvastatin (CRESTOR) 5 MG tablet, Take 1 tablet (5 mg total) by mouth daily., Disp: 90 tablet, Rfl: 1 .  vitamin E 400 UNIT capsule, Take 400 Units daily by mouth., Disp: , Rfl:  .  Blood Glucose Monitoring Suppl (ONE TOUCH ULTRA 2) w/Device KIT, Test blood  sugar twice daily, Disp: 1 kit, Rfl: 0 .  glucose blood (ONE TOUCH ULTRA TEST) test strip, Test blood sugar twice daily, Disp: 100 each, Rfl: 12 .  Lancets (ONETOUCH ULTRASOFT) lancets, Use as instructed, Disp: 100 each, Rfl: 12    Physical Exam(=30) BP (!) 108/55   Pulse 73   Temp 97.9 F (36.6 C) (Oral)   Resp 15   Ht 5' 3" (1.6 m)   Wt 50.3 kg   SpO2 100%   BMI 19.66 kg/m   Gen. Appearance normal appearance grooming and hygiene Peripheral vascular system normal temperature color capillary refill Lymph nodes ARE NORMAL  Gait not able to walk  Left Upper extremity  Inspection revealed no malalignment or asymmetry  Assessment of range of motion: Full range of motion was recorded  Assessment of stability: Elbow wrist and hand and shoulder were stable  Assessment of muscle strength and tone revealed grade 5 muscle strength and normal muscle tone  Skin was normal without rash lesion or ulceration  Right upper extremity  Inspection revealed no malalignment or asymmetry  Assessment of range of motion: Full range of motion was recorded  Assessment of stability: Elbow wrist and hand and shoulder were stable  Assessment of muscle strength and tone revealed grade 5 muscle strength and normal muscle tone  Skin was normal without rash lesion or ulceration  Right Lower extremity  Inspection revealed no malalignment or asymmetry  Assessment of range of motion: Full range of motion was recorded  Assessment of stability: Ankle, knee and hip were stable  Assessment of muscle strength and tone revealed grade 5 muscle strength and normal muscle tone  Skin was normal without rash lesion or ulceration  Left lower extremity Skin normal Tenderness proximal thigh Range of motion unable to test No stability tests were done because of pain Muscle tone was normal no tremor   Coordination was tested by finger-to-nose nose and was normal Deep tendon reflexes were 2+ in the upper extremities   Examination of sensation by touch was normal  Mental status  Oriented to time person and place normal  Mood and affect normal without depression anxiety or agitation  Dx:   Data Reviewed  ER RECORD REVIEWED: CONFIRMS HISTORY   I reviewed the following images and the reports and my independent interpretation is comminuted intertrochanteric fracture looks like a three-part intact posterior medial buttress  This will require surgery  Assessment  CBC Latest Ref Rng & Units 03/26/2020 03/25/2020 03/17/2020  WBC 4.0 - 10.5 K/uL 13.3(H) 16.0(H) 5.5  Hemoglobin 12.0 - 15.0 g/dL 10.4(L) 12.3 14.7  Hematocrit 36 - 46 % 30.8(L) 35.9(L) 43.2  Platelets 150 - 400 K/uL 172 180 194   BMP Latest Ref Rng & Units 03/26/2020 03/25/2020 03/17/2020  Glucose 70 - 99 mg/dL 192(H)  184(H) 124(H)  BUN 8 - 23 mg/dL 10 11 9  Creatinine 0.44 - 1.00 mg/dL 0.57 0.59 0.69  BUN/Creat Ratio 6 - 22 (calc) - - NOT APPLICABLE  Sodium 135 - 145 mmol/L 130(L) 132(L) 139  Potassium 3.5 - 5.1 mmol/L 4.2 3.4(L) 4.0  Chloride 98 - 111 mmol/L 98 98 102  CO2 22 - 32 mmol/L 26 24 30  Calcium 8.9 - 10.3 mg/dL 8.7(L) 8.8(L) 10.1     Plan  Open treatment internal fixation left hip intramedullary gamma nail    E  MD   

## 2020-03-26 NOTE — Progress Notes (Signed)
Pt bladder scanned and showed greater than 999 mL. Notified Dr. Gwenlyn Perking and received order to place Foley catheter.

## 2020-03-27 ENCOUNTER — Inpatient Hospital Stay (HOSPITAL_COMMUNITY): Payer: Medicare Other

## 2020-03-27 ENCOUNTER — Encounter (HOSPITAL_COMMUNITY)
Admission: EM | Disposition: A | Discharge status: Skilled Nursing Facility | Payer: Self-pay | Source: Home / Self Care | Attending: Internal Medicine

## 2020-03-27 ENCOUNTER — Encounter (HOSPITAL_COMMUNITY): Payer: Self-pay | Admitting: Family Medicine

## 2020-03-27 ENCOUNTER — Inpatient Hospital Stay (HOSPITAL_COMMUNITY): Payer: Medicare Other | Admitting: Anesthesiology

## 2020-03-27 DIAGNOSIS — I10 Essential (primary) hypertension: Secondary | ICD-10-CM | POA: Diagnosis not present

## 2020-03-27 DIAGNOSIS — Z87891 Personal history of nicotine dependence: Secondary | ICD-10-CM | POA: Diagnosis not present

## 2020-03-27 DIAGNOSIS — E119 Type 2 diabetes mellitus without complications: Secondary | ICD-10-CM | POA: Diagnosis not present

## 2020-03-27 DIAGNOSIS — S72142A Displaced intertrochanteric fracture of left femur, initial encounter for closed fracture: Secondary | ICD-10-CM | POA: Diagnosis not present

## 2020-03-27 HISTORY — PX: INTRAMEDULLARY (IM) NAIL INTERTROCHANTERIC: SHX5875

## 2020-03-27 LAB — GLUCOSE, CAPILLARY
Glucose-Capillary: 134 mg/dL — ABNORMAL HIGH (ref 70–99)
Glucose-Capillary: 123 mg/dL — ABNORMAL HIGH (ref 70–99)
Glucose-Capillary: 112 mg/dL — ABNORMAL HIGH (ref 70–99)
Glucose-Capillary: 119 mg/dL — ABNORMAL HIGH (ref 70–99)
Glucose-Capillary: 135 mg/dL — ABNORMAL HIGH (ref 70–99)
Glucose-Capillary: 152 mg/dL — ABNORMAL HIGH (ref 70–99)

## 2020-03-27 SURGERY — FIXATION, FRACTURE, INTERTROCHANTERIC, WITH INTRAMEDULLARY ROD
Anesthesia: General | Site: Hip | Laterality: Left

## 2020-03-27 MED ORDER — FENTANYL CITRATE (PF) 100 MCG/2ML IJ SOLN
INTRAMUSCULAR | Status: AC
  Filled 2020-03-27: qty 2

## 2020-03-27 MED ORDER — CEFAZOLIN SODIUM-DEXTROSE 2-4 GM/100ML-% IV SOLN
2.0000 g | INTRAVENOUS | Status: AC
  Administered 2020-03-27: 2 g via INTRAVENOUS
  Filled 2020-03-27: qty 100

## 2020-03-27 MED ORDER — ESMOLOL HCL 100 MG/10ML IV SOLN
INTRAVENOUS | Status: DC | PRN
  Administered 2020-03-27: 10 mg via INTRAVENOUS

## 2020-03-27 MED ORDER — METOCLOPRAMIDE HCL 5 MG/ML IJ SOLN: 5.0000 mg | Freq: Three times a day (TID) | INTRAMUSCULAR | Status: DC | PRN

## 2020-03-27 MED ORDER — METHOCARBAMOL 1000 MG/10ML IJ SOLN
500.0000 mg | Freq: Four times a day (QID) | INTRAVENOUS | Status: DC | PRN
  Filled 2020-03-27: qty 5

## 2020-03-27 MED ORDER — MORPHINE SULFATE (PF) 2 MG/ML IV SOLN
0.5000 mg | INTRAVENOUS | Status: DC | PRN
  Administered 2020-03-29: 0.5 mg via INTRAVENOUS
  Filled 2020-03-27: qty 1

## 2020-03-27 MED ORDER — BUPIVACAINE-EPINEPHRINE (PF) 0.5% -1:200000 IJ SOLN
INTRAMUSCULAR | Status: AC
  Filled 2020-03-27: qty 30

## 2020-03-27 MED ORDER — BUPIVACAINE HCL (PF) 0.5 % IJ SOLN
INTRAMUSCULAR | Status: DC | PRN
  Administered 2020-03-27: 1.4 mL via INTRATHECAL

## 2020-03-27 MED ORDER — PROPOFOL 10 MG/ML IV BOLUS
INTRAVENOUS | Status: AC
  Filled 2020-03-27: qty 20

## 2020-03-27 MED ORDER — MEPERIDINE HCL 50 MG/ML IJ SOLN: 6.2500 mg | INTRAMUSCULAR | Status: DC | PRN

## 2020-03-27 MED ORDER — CHLORHEXIDINE GLUCONATE 0.12 % MT SOLN
OROMUCOSAL | Status: AC
  Filled 2020-03-27: qty 15

## 2020-03-27 MED ORDER — SODIUM CHLORIDE 0.9 % IV SOLN: INTRAVENOUS | Status: DC

## 2020-03-27 MED ORDER — PHENOL 1.4 % MT LIQD: 1.0000 | OROMUCOSAL | Status: DC | PRN

## 2020-03-27 MED ORDER — ACETAMINOPHEN 10 MG/ML IV SOLN
1000.0000 mg | Freq: Four times a day (QID) | INTRAVENOUS | Status: DC
  Administered 2020-03-27: 1000 mg via INTRAVENOUS

## 2020-03-27 MED ORDER — EPHEDRINE SULFATE 50 MG/ML IJ SOLN
INTRAMUSCULAR | Status: DC | PRN
  Administered 2020-03-27: 5 mg via INTRAVENOUS

## 2020-03-27 MED ORDER — LACTATED RINGERS IV SOLN: INTRAVENOUS | Status: DC | PRN

## 2020-03-27 MED ORDER — CEFAZOLIN SODIUM-DEXTROSE 2-4 GM/100ML-% IV SOLN
INTRAVENOUS | Status: AC
  Filled 2020-03-27: qty 100

## 2020-03-27 MED ORDER — ONDANSETRON HCL 4 MG PO TABS: 4.0000 mg | ORAL_TABLET | Freq: Four times a day (QID) | ORAL | Status: DC | PRN

## 2020-03-27 MED ORDER — CHLORHEXIDINE GLUCONATE 4 % EX LIQD
60.0000 mL | Freq: Once | CUTANEOUS | Status: AC
  Administered 2020-03-27: 4 via TOPICAL
  Filled 2020-03-27: qty 15

## 2020-03-27 MED ORDER — PHENYLEPHRINE 40 MCG/ML (10ML) SYRINGE FOR IV PUSH (FOR BLOOD PRESSURE SUPPORT)
PREFILLED_SYRINGE | INTRAVENOUS | Status: AC
  Filled 2020-03-27: qty 30

## 2020-03-27 MED ORDER — KETOROLAC TROMETHAMINE 30 MG/ML IJ SOLN
INTRAMUSCULAR | Status: AC
  Filled 2020-03-27: qty 1

## 2020-03-27 MED ORDER — ONDANSETRON HCL 4 MG/2ML IJ SOLN: 4.0000 mg | Freq: Four times a day (QID) | INTRAMUSCULAR | Status: DC | PRN

## 2020-03-27 MED ORDER — LIDOCAINE 2% (20 MG/ML) 5 ML SYRINGE
INTRAMUSCULAR | Status: AC
  Filled 2020-03-27: qty 10

## 2020-03-27 MED ORDER — MENTHOL 3 MG MT LOZG: 1.0000 | LOZENGE | OROMUCOSAL | Status: DC | PRN

## 2020-03-27 MED ORDER — ONDANSETRON HCL 4 MG/2ML IJ SOLN
INTRAMUSCULAR | Status: AC
  Filled 2020-03-27: qty 2

## 2020-03-27 MED ORDER — POVIDONE-IODINE 10 % EX SWAB: 2.0000 "application " | Freq: Once | CUTANEOUS | Status: DC

## 2020-03-27 MED ORDER — BUPIVACAINE HCL (PF) 0.5 % IJ SOLN
INTRAMUSCULAR | Status: AC
  Filled 2020-03-27: qty 30

## 2020-03-27 MED ORDER — METOCLOPRAMIDE HCL 10 MG PO TABS: 5.0000 mg | ORAL_TABLET | Freq: Three times a day (TID) | ORAL | Status: DC | PRN

## 2020-03-27 MED ORDER — ACETAMINOPHEN 10 MG/ML IV SOLN
INTRAVENOUS | Status: AC
  Filled 2020-03-27: qty 100

## 2020-03-27 MED ORDER — FENTANYL CITRATE (PF) 100 MCG/2ML IJ SOLN
INTRAMUSCULAR | Status: DC | PRN
  Administered 2020-03-27: 15 ug via INTRATHECAL

## 2020-03-27 MED ORDER — ASPIRIN EC 325 MG PO TBEC
325.0000 mg | DELAYED_RELEASE_TABLET | Freq: Every day | ORAL | Status: DC
  Administered 2020-03-28 – 2020-03-30 (×3): 325 mg via ORAL
  Filled 2020-03-27 (×3): qty 1

## 2020-03-27 MED ORDER — PHENYLEPHRINE HCL (PRESSORS) 10 MG/ML IV SOLN
INTRAVENOUS | Status: DC | PRN
  Administered 2020-03-27 (×2): 80 ug via INTRAVENOUS

## 2020-03-27 MED ORDER — MIDAZOLAM HCL 2 MG/2ML IJ SOLN
INTRAMUSCULAR | Status: AC
  Filled 2020-03-27: qty 2

## 2020-03-27 MED ORDER — MIDAZOLAM HCL 2 MG/2ML IJ SOLN
INTRAMUSCULAR | Status: DC | PRN
  Administered 2020-03-27 (×2): .5 mg via INTRAVENOUS

## 2020-03-27 MED ORDER — SENNOSIDES-DOCUSATE SODIUM 8.6-50 MG PO TABS: 1.0000 | ORAL_TABLET | Freq: Every evening | ORAL | Status: DC | PRN

## 2020-03-27 MED ORDER — ONDANSETRON HCL 4 MG/2ML IJ SOLN
INTRAMUSCULAR | Status: DC | PRN
  Administered 2020-03-27: 4 mg via INTRAVENOUS

## 2020-03-27 MED ORDER — SODIUM CHLORIDE 0.9 % IR SOLN
Status: DC | PRN
  Administered 2020-03-27: 1000 mL

## 2020-03-27 MED ORDER — FENTANYL CITRATE (PF) 100 MCG/2ML IJ SOLN
INTRAMUSCULAR | Status: DC | PRN
Start: 2020-03-27 — End: 2020-03-27
  Administered 2020-03-27: 25 ug via INTRAVENOUS

## 2020-03-27 MED ORDER — ORAL CARE MOUTH RINSE: 15.0000 mL | Freq: Once | OROMUCOSAL | Status: AC

## 2020-03-27 MED ORDER — HYDROMORPHONE HCL 1 MG/ML IJ SOLN: 0.2500 mg | INTRAMUSCULAR | Status: DC | PRN

## 2020-03-27 MED ORDER — BUPIVACAINE-EPINEPHRINE (PF) 0.5% -1:200000 IJ SOLN
INTRAMUSCULAR | Status: DC | PRN
  Administered 2020-03-27: 30 mL

## 2020-03-27 MED ORDER — LACTATED RINGERS IV SOLN
Freq: Once | INTRAVENOUS | Status: AC
  Administered 2020-03-27: 1000 mL via INTRAVENOUS

## 2020-03-27 MED ORDER — DOCUSATE SODIUM 100 MG PO CAPS
100.0000 mg | ORAL_CAPSULE | Freq: Two times a day (BID) | ORAL | Status: DC
  Administered 2020-03-27 – 2020-03-30 (×6): 100 mg via ORAL
  Filled 2020-03-27 (×6): qty 1

## 2020-03-27 MED ORDER — METHOCARBAMOL 500 MG PO TABS: 500.0000 mg | ORAL_TABLET | Freq: Four times a day (QID) | ORAL | Status: DC | PRN

## 2020-03-27 MED ORDER — ARTIFICIAL TEARS OPHTHALMIC OINT
TOPICAL_OINTMENT | OPHTHALMIC | Status: AC
  Filled 2020-03-27: qty 3.5

## 2020-03-27 MED ORDER — CEFAZOLIN SODIUM-DEXTROSE 2-4 GM/100ML-% IV SOLN
2.0000 g | Freq: Four times a day (QID) | INTRAVENOUS | Status: AC
  Administered 2020-03-27 – 2020-03-28 (×2): 2 g via INTRAVENOUS
  Filled 2020-03-27 (×2): qty 100

## 2020-03-27 MED ORDER — CHLORHEXIDINE GLUCONATE 0.12 % MT SOLN
15.0000 mL | Freq: Once | OROMUCOSAL | Status: AC
  Administered 2020-03-27: 15 mL via OROMUCOSAL

## 2020-03-27 MED ORDER — BISACODYL 10 MG RE SUPP: 10.0000 mg | Freq: Every day | RECTAL | Status: DC | PRN

## 2020-03-27 MED ORDER — EPHEDRINE 5 MG/ML INJ
INTRAVENOUS | Status: AC
  Filled 2020-03-27: qty 10

## 2020-03-27 MED ORDER — PROPOFOL 500 MG/50ML IV EMUL
INTRAVENOUS | Status: DC | PRN
  Administered 2020-03-27: 50 ug/kg/min via INTRAVENOUS

## 2020-03-27 MED ORDER — MAGNESIUM CITRATE PO SOLN: 1.0000 | Freq: Once | ORAL | Status: DC | PRN

## 2020-03-27 MED ORDER — ONDANSETRON HCL 4 MG/2ML IJ SOLN: 4.0000 mg | Freq: Once | INTRAMUSCULAR | Status: DC | PRN

## 2020-03-27 MED ORDER — TRAMADOL HCL 50 MG PO TABS
50.0000 mg | ORAL_TABLET | Freq: Four times a day (QID) | ORAL | Status: DC
  Administered 2020-03-27 – 2020-03-30 (×13): 50 mg via ORAL
  Filled 2020-03-27 (×13): qty 1

## 2020-03-27 SURGICAL SUPPLY — 64 items
APL PRP STRL LF DISP 70% ISPRP (MISCELLANEOUS) ×1
APL SKNCLS STERI-STRIP NONHPOA (GAUZE/BANDAGES/DRESSINGS)
BENZOIN TINCTURE PRP APPL 2/3 (GAUZE/BANDAGES/DRESSINGS) IMPLANT
BIT DRILL AO GAMMA 4.2X300 (BIT) ×3 IMPLANT
BLADE SURG SZ10 CARB STEEL (BLADE) ×6 IMPLANT
BNDG GAUZE ELAST 4 BULKY (GAUZE/BANDAGES/DRESSINGS) ×3 IMPLANT
CHLORAPREP W/TINT 26 (MISCELLANEOUS) ×3 IMPLANT
CLOSURE STERI-STRIP 1/2X4 (GAUZE/BANDAGES/DRESSINGS)
CLOTH BEACON ORANGE TIMEOUT ST (SAFETY) ×3 IMPLANT
CLSR STERI-STRIP ANTIMIC 1/2X4 (GAUZE/BANDAGES/DRESSINGS) IMPLANT
COVER LIGHT HANDLE STERIS (MISCELLANEOUS) ×6 IMPLANT
COVER MAYO STAND XLG (MISCELLANEOUS) ×3 IMPLANT
COVER WAND RF STERILE (DRAPES) ×3 IMPLANT
DECANTER SPIKE VIAL GLASS SM (MISCELLANEOUS) ×6 IMPLANT
DRAPE STERI IOBAN 125X83 (DRAPES) ×3 IMPLANT
DRSG MEPILEX BORDER 4X12 (GAUZE/BANDAGES/DRESSINGS) ×3 IMPLANT
DRSG MEPILEX SACRM 8.7X9.8 (GAUZE/BANDAGES/DRESSINGS) ×3 IMPLANT
DRSG PAD ABDOMINAL 8X10 ST (GAUZE/BANDAGES/DRESSINGS) ×3 IMPLANT
ELECT REM PT RETURN 9FT ADLT (ELECTROSURGICAL) ×3
ELECTRODE REM PT RTRN 9FT ADLT (ELECTROSURGICAL) ×1 IMPLANT
GLOVE BIO SURGEON STRL SZ7 (GLOVE) ×2 IMPLANT
GLOVE BIOGEL PI IND STRL 7.0 (GLOVE) ×2 IMPLANT
GLOVE BIOGEL PI INDICATOR 7.0 (GLOVE) ×8
GLOVE INDICATOR 7.0 STRL GRN (GLOVE) ×2 IMPLANT
GLOVE SKINSENSE NS SZ8.0 LF (GLOVE) ×2
GLOVE SKINSENSE STRL SZ8.0 LF (GLOVE) ×1 IMPLANT
GLOVE SS N UNI LF 8.5 STRL (GLOVE) ×3 IMPLANT
GOWN STRL REUS W/TWL LRG LVL3 (GOWN DISPOSABLE) ×3 IMPLANT
GOWN STRL REUS W/TWL XL LVL3 (GOWN DISPOSABLE) ×3 IMPLANT
GUIDEROD T2 3X1000 (ROD) ×3 IMPLANT
INST SET MAJOR BONE (KITS) ×3 IMPLANT
K-WIRE  3.2X450M STR (WIRE) ×3
K-WIRE 3.2X450M STR (WIRE) ×1
KIT BLADEGUARD II DBL (SET/KITS/TRAYS/PACK) ×3 IMPLANT
KIT TURNOVER CYSTO (KITS) ×3 IMPLANT
KWIRE 3.2X450M STR (WIRE) ×1 IMPLANT
MANIFOLD NEPTUNE II (INSTRUMENTS) ×3 IMPLANT
MARKER SKIN DUAL TIP RULER LAB (MISCELLANEOUS) ×3 IMPLANT
NAIL TROCH GAMMA 11X18 (Nail) ×2 IMPLANT
NDL HYPO 21X1.5 SAFETY (NEEDLE) ×1 IMPLANT
NDL SPNL 18GX3.5 QUINCKE PK (NEEDLE) ×1 IMPLANT
NEEDLE HYPO 21X1.5 SAFETY (NEEDLE) ×3 IMPLANT
NEEDLE SPNL 18GX3.5 QUINCKE PK (NEEDLE) ×3 IMPLANT
NS IRRIG 1000ML POUR BTL (IV SOLUTION) ×3 IMPLANT
PACK BASIC III (CUSTOM PROCEDURE TRAY) ×3
PACK SRG BSC III STRL LF ECLPS (CUSTOM PROCEDURE TRAY) ×1 IMPLANT
PAD ARMBOARD 7.5X6 YLW CONV (MISCELLANEOUS) ×3 IMPLANT
PENCIL SMOKE EVACUATOR COATED (MISCELLANEOUS) ×3 IMPLANT
SCREW LAG GAMMA 3 TI 10.5X90MM (Screw) ×2 IMPLANT
SCREW LOCKING T2 F/T  5MMX40MM (Screw) ×3 IMPLANT
SCREW LOCKING T2 F/T 5MMX40MM (Screw) IMPLANT
SET BASIN LINEN APH (SET/KITS/TRAYS/PACK) ×3 IMPLANT
SPONGE LAP 18X18 RF (DISPOSABLE) ×6 IMPLANT
STAPLER VISISTAT 35W (STAPLE) ×2 IMPLANT
SUT BRALON NAB BRD #1 30IN (SUTURE) ×3 IMPLANT
SUT MNCRL 0 VIOLET CTX 36 (SUTURE) ×1 IMPLANT
SUT MON AB 2-0 CT1 36 (SUTURE) ×3 IMPLANT
SUT MONOCRYL 0 CTX 36 (SUTURE) ×3
SYR 30ML LL (SYRINGE) ×3 IMPLANT
SYR BULB IRRIG 60ML STRL (SYRINGE) ×6 IMPLANT
SYR TOOMEY 50ML (SYRINGE) ×2 IMPLANT
TRAY FOLEY MTR SLVR 16FR STAT (SET/KITS/TRAYS/PACK) ×3 IMPLANT
YANKAUER SUCT BULB TIP 10FT TU (MISCELLANEOUS) ×2 IMPLANT
YANKAUER SUCT BULB TIP NO VENT (SUCTIONS) ×3 IMPLANT

## 2020-03-27 NOTE — NC FL2 (Signed)
[place] MEDICAID FL2 LEVEL OF CARE SCREENING TOOL     IDENTIFICATION  Patient Name: Susan Schmidt Birthdate: May 01, 1946 Sex: female Admission Date (Current Location): 03/25/2020  [facility] and [place] Number:  [employer] and Address:  [facility],  [address] [place] [address]      Provider Number:    Attending Physician Name and Address:  Vassie Loll, MD  Relative Name and Phone Number:       Current Level of Care: Hospital Recommended Level of Care: Skilled Nursing Facility Prior Approval Number:    Date Approved/Denied: 03/27/20 PASRR Number: [id number]  Discharge Plan: SNF    Current Diagnoses: Patient Active Problem List   Diagnosis Date Noted  . Closed left hip fracture (HCC) 03/25/2020  . Special screening for malignant neoplasms, colon 04/05/2019  . Controlled diabetes mellitus (HCC) 01/04/2019  . Hyperlipidemia   . Age-related osteoporosis without current pathological fracture 01/01/2018  . Essential hypertension 01/01/2018    Orientation RESPIRATION BLADDER Height & Weight     Self, Time, Situation, Place  Normal Continent Weight: 118 lb 6.2 oz (53.7 kg) Height:  5\' 3"  (160 cm)  BEHAVIORAL SYMPTOMS/MOOD NEUROLOGICAL BOWEL NUTRITION STATUS      Continent Diet (Diet Heart Room service appropriate? Yes; Fluid consistency: Thin)  AMBULATORY STATUS COMMUNICATION OF NEEDS Skin   Extensive Assist Verbally Surgical wounds (Hip)                       Personal Care Assistance Level of Assistance  Feeding, Bathing, Dressing Bathing Assistance: Maximum assistance Feeding assistance: Limited assistance Dressing Assistance: Maximum assistance     Functional Limitations Info  Sight, Hearing, Speech Sight Info: Adequate Hearing Info: Adequate Speech Info: Adequate    SPECIAL CARE FACTORS FREQUENCY  PT (By licensed PT)     PT Frequency: 5x              Contractures Contractures Info: Not present     Additional Factors Info  Code Status Code Status Info: Full             Current Medications (03/27/2020):  This is the current hospital active medication list Current Facility-Administered Medications  Medication Dose Route Frequency Provider Last Rate Last Admin  . [MAR Hold] acetaminophen (TYLENOL) tablet 650 mg  650 mg Oral Q6H PRN Zierle-Ghosh, Asia B, DO       Or  . [MAR Hold] acetaminophen (TYLENOL) suppository 650 mg  650 mg Rectal Q6H PRN Zierle-Ghosh, Asia B, DO      . bupivacaine-epinephrine (MARCAINE W/ EPI) 0.5% -1:200000 injection    PRN 03/29/2020, MD   30 mL at 03/27/20 1312  . [MAR Hold] calcium-vitamin D (OSCAL WITH D) 500-200 MG-UNIT per tablet 1 tablet  1 tablet Oral Daily Zierle-Ghosh, Asia B, DO   1 tablet at 03/26/20 1157  . [MAR Hold] Chlorhexidine Gluconate Cloth 2 % PADS 6 each  6 each Topical Daily Zierle-Ghosh, Asia B, DO   6 each at 03/27/20 0657  . [MAR Hold] insulin aspart (novoLOG) injection 0-9 Units  0-9 Units Subcutaneous TID WC Zierle-Ghosh, Asia B, DO   1 Units at 03/27/20 0944  . lactated ringers infusion   Intravenous Continuous Zierle-Ghosh, Asia B, DO 100 mL/hr at 03/27/20 1202 Continued from Pre-op at 03/27/20 1202  . [MAR Hold] morphine 4 MG/ML injection 4 mg  4 mg Intravenous Q1H PRN Zierle-Ghosh, Asia B, DO   4 mg at 03/27/20 0953  . [  MAR Hold] mupirocin ointment (BACTROBAN) 2 % 1 application  1 application Nasal BID Zierle-Ghosh, Asia B, DO   1 application at 03/27/20 0944  . [MAR Hold] ondansetron (ZOFRAN) injection 4 mg  4 mg Intravenous Q8H PRN Zierle-Ghosh, Asia B, DO   4 mg at 03/27/20 0950  . [MAR Hold] oxyCODONE (Oxy IR/ROXICODONE) immediate release tablet 5 mg  5 mg Oral Q4H PRN Zierle-Ghosh, Asia B, DO   5 mg at 03/25/20 2135  . [MAR Hold] polyethylene glycol (MIRALAX / GLYCOLAX) packet 17 g  17 g Oral Daily PRN Zierle-Ghosh, Asia B, DO      . povidone-iodine 10 % swab 2 application  2 application Topical Once Vickki Hearing, MD      . Mitzi Hansen Hold] rosuvastatin (CRESTOR) tablet 5 mg  5 mg Oral Daily Zierle-Ghosh, Asia B, DO   5 mg at 03/26/20 1107  . sodium chloride irrigation 0.9 %    PRN Vickki Hearing, MD   1,000 mL at 03/27/20 1307   Facility-Administered Medications Ordered in Other Encounters  Medication Dose Route Frequency Provider Last Rate Last Admin  . bupivacaine (MARCAINE) 0.5 % injection   Intrathecal Anesthesia Intra-op Battula, Rajamani C, MD   1.4 mL at 03/27/20 1226  . ePHEDrine injection   Intravenous Anesthesia Intra-op Earmon Phoenix, CRNA   5 mg at 03/27/20 1221  . esmolol (BREVIBLOC) injection   Intravenous Anesthesia Intra-op Earmon Phoenix, CRNA   10 mg at 03/27/20 1230  . fentaNYL (SUBLIMAZE) injection   Intravenous Anesthesia Intra-op Earmon Phoenix, CRNA   25 mcg at 03/27/20 1205  . fentaNYL (SUBLIMAZE) injection   Intrathecal Anesthesia Intra-op Molli Barrows, MD   15 mcg at 03/27/20 1226  . midazolam (VERSED) injection   Intravenous Anesthesia Intra-op Earmon Phoenix, CRNA   0.5 mg at 03/27/20 1207  . ondansetron (ZOFRAN) injection   Intravenous Anesthesia Intra-op Earmon Phoenix, CRNA   4 mg at 03/27/20 1325  . phenylephrine (NEO-SYNEPHRINE) injection   Intravenous Anesthesia Intra-op Earmon Phoenix, CRNA   80 mcg at 03/27/20 1220  . propofol (DIPRIVAN) 500 MG/50ML infusion   Intravenous Continuous PRN Earmon Phoenix, CRNA 16.11 mL/hr at 03/27/20 1209 50 mcg/kg/min at 03/27/20 1209     Discharge Medications: Please see discharge summary for a list of discharge medications.  Relevant Imaging Results:  Relevant Lab Results:   Additional Information Pt SSN: [id number]  Barry Brunner, LCSW

## 2020-03-27 NOTE — Interval H&P Note (Signed)
History and Physical Interval Note:  03/27/2020 11:54 AM  Susan Schmidt  has presented today for surgery, with the diagnosis of left hip fracture.  The various methods of treatment have been discussed with the patient and family. After consideration of risks, benefits and other options for treatment, the patient has consented to  Procedure(s): INTRAMEDULLARY (IM) NAIL INTERTROCHANTRIC (Left) as a surgical intervention.  The patient's history has been reviewed, patient examined, no change in status, stable for surgery.  I have reviewed the patient's chart and labs.  Questions were answered to the patient's satisfaction.     Fuller Canada

## 2020-03-27 NOTE — Op Note (Signed)
03/27/2020  1:55 PM  PATIENT:  Susan Schmidt  74 y.o. female  PRE-OPERATIVE DIAGNOSIS:  left hip fracture  POST-OPERATIVE DIAGNOSIS:  left hip fracture  PROCEDURE:  Procedure(s): INTRAMEDULLARY (IM) NAIL INTERTROCHANTRIC (Left)  SURGEON:  Surgeon(s) and Role:    Vickki Hearing, MD - Primary  IMPLANTS: Stryker gamma nail, short nail 11 x 188 mm x 825 degrees, 10.5 x 90 mm lag screw, 5 x 40 fully threaded distal locking screw with acorn proximal screw in sliding mode  PHYSICIAN ASSISTANT:   ASSISTANTS: cynthia wrenn   ANESTHESIA:   spinal  EBL:  20 mL   BLOOD ADMINISTERED:none  DRAINS: none   LOCAL MEDICATIONS USED:  MARCAINE     SPECIMEN:  No Specimen  DISPOSITION OF SPECIMEN:  N/A  COUNTS:  YES  TOURNIQUET:  * No tourniquets in log *  DICTATION: .Dragon Dictation  PLAN OF CARE: Admit to inpatient   PATIENT DISPOSITION:  PACU - hemodynamically stable.   Delay start of Pharmacological VTE agent (>24hrs) due to surgical blood loss or risk of bleeding: yes  Patient was seen in preop.  Reevaluation was performed including chart review patient was cleared for surgery.  Surgical site was confirmed and marked his left hip.  Patient was taken the operating for spinal anesthesia followed by 2 g of Ancef IV  A Foley catheter was placed she was then placed on the fracture table in the supine position.  The right leg was placed in a well leg holder with appropriate padding perineal post was padded the left leg was placed in a traction device  C-arm was brought in and the left lower extremity was manipulated with traction and internal rotation until a stable reduction was obtained.  The posterior medial buttress was intact  I chose 125 mm short nail which was opened and placed on the back table  The left leg was then prepped and draped sterilely followed by timeout which time x-rays were reviewed.  Implants had been checked prior to entering the operating  room.  Spinal needle was used to outline the proximal femur.  An incision was made over the greater trochanter extended proximally divided the subcutaneous tissue down to the fascia which was cleared with a sharp pelvic periosteal elevator.  The fascia was split in line with the skin incision until the greater trochanter was visualized.  Curved awl was used to enter the trochanter and passed into the femoral canal and confirmed by x-ray  Guidewire was then placed down the femoral canal through the cannulated awl the awl was removed and the cannulated reamer was passed over the guidewire down to the lesser trochanter  The nail was then passed over the guidewire and passed easily.  A second incision was made distal to the first incision fascia was split with the periosteal elevator and the cannula was advanced down to bone.  Lateral x-ray was taken to assess rotation.  Perforating drill bit was passed followed by threaded tip guidewire up until the subchondral bone with appropriate tip to apex distance  The guidepin measured 90 mm the triple reamer was set for 90 and passed over the guidewire.  The lag screw was then placed over the guidewire.  Proximal portion of the nail was irrigated and suctioned and the acorn was passed and confirmed to be engaged by twisting the screwdriver over the lag screw  We then turned our attention to the distal locking screw third incision was made the cannula was advanced  down to bone with a sharp trocar  We drilled with appropriate drill bit and measured off the drill bit a 40 mm screw was passed  Final imaging of all the hardware was performed and was found to be intact fracture had a good reduction with stable posterior medial buttress  Wounds were irrigated and closed proximally with #1 Braylon, 0 Monocryl and staples injecting 30 cc of Marcaine beneath the fascia  The other 2 wounds were closed with 0 Monocryl and staples  Along dressing was applied  Postop  plan  wbat Staples out pod 14  xrays in the office OCT 14  dvt prophylaxis for 35 days with aspirin

## 2020-03-27 NOTE — Brief Op Note (Signed)
03/27/2020  1:47 PM  PATIENT:  Susan Schmidt  74 y.o. female  PRE-OPERATIVE DIAGNOSIS:  left hip fracture  POST-OPERATIVE DIAGNOSIS:  left hip fracture  PROCEDURE:  Procedure(s): INTRAMEDULLARY (IM) NAIL INTERTROCHANTRIC (Left)  SURGEON:  Surgeon(s) and Role:    Vickki Hearing, MD - Primary  IMPLANTS: Stryker gamma nail, short nail 11 x 188 mm x 825 degrees, 10.5 x 90 mm lag screw, 5 x 40 fully threaded distal locking screw with acorn proximal screw in sliding mode  PHYSICIAN ASSISTANT:   ASSISTANTS: cynthia wrenn   ANESTHESIA:   spinal  EBL:  20 mL   BLOOD ADMINISTERED:none  DRAINS: none   LOCAL MEDICATIONS USED:  MARCAINE     SPECIMEN:  No Specimen  DISPOSITION OF SPECIMEN:  N/A  COUNTS:  YES  TOURNIQUET:  * No tourniquets in log *  DICTATION: .Dragon Dictation  PLAN OF CARE: Admit to inpatient   PATIENT DISPOSITION:  PACU - hemodynamically stable.   Delay start of Pharmacological VTE agent (>24hrs) due to surgical blood loss or risk of bleeding: yes  Patient was seen in preop.  Reevaluation was performed including chart review patient was cleared for surgery.  Surgical site was confirmed and marked his left hip.  Patient was taken the operating for spinal anesthesia followed by 2 g of Ancef IV  A Foley catheter was placed she was then placed on the fracture table in the supine position.  The right leg was placed in a well leg holder with appropriate padding perineal post was padded the left leg was placed in a traction device  C-arm was brought in and the left lower extremity was manipulated with traction and internal rotation until a stable reduction was obtained.  The posterior medial buttress was intact  I chose 125 mm short nail which was opened and placed on the back table  The left leg was then prepped and draped sterilely followed by timeout which time x-rays were reviewed.  Implants had been checked prior to entering the operating  room.  Spinal needle was used to outline the proximal femur.  An incision was made over the greater trochanter extended proximally divided the subcutaneous tissue down to the fascia which was cleared with a sharp pelvic periosteal elevator.  The fascia was split in line with the skin incision until the greater trochanter was visualized.  Curved awl was used to enter the trochanter and passed into the femoral canal and confirmed by x-ray  Guidewire was then placed down the femoral canal through the cannulated awl the awl was removed and the cannulated reamer was passed over the guidewire down to the lesser trochanter  The nail was then passed over the guidewire and passed easily.  A second incision was made distal to the first incision fascia was split with the periosteal elevator and the cannula was advanced down to bone.  Lateral x-ray was taken to assess rotation.  Perforating drill bit was passed followed by threaded tip guidewire up until the subchondral bone with appropriate tip to apex distance  The guidepin measured 90 mm the triple reamer was set for 90 and passed over the guidewire.  The lag screw was then placed over the guidewire.  Proximal portion of the nail was irrigated and suctioned and the acorn was passed and confirmed to be engaged by twisting the screwdriver over the lag screw  We then turned our attention to the distal locking screw third incision was made the cannula was advanced  down to bone with a sharp trocar  We drilled with appropriate drill bit and measured off the drill bit a 40 mm screw was passed  Final imaging of all the hardware was performed and was found to be intact fracture had a good reduction with stable posterior medial buttress  Wounds were irrigated and closed proximally with #1 Braylon, 0 Monocryl and staples injecting 30 cc of Marcaine beneath the fascia  The other 2 wounds were closed with 0 Monocryl and staples  Along dressing was applied  Postop  plan  wbat Staples out pod 14  xrays in the office OCT 14  dvt prophylaxis for 35 days with aspirin

## 2020-03-27 NOTE — Progress Notes (Signed)
PROGRESS NOTE    Susan Schmidt  OZY:[id number] DOB: Nov 29, 1945 DOA: 03/25/2020 PCP: Shirline Frees, NP    Chief Complaint  Patient presents with   Fall    Brief Narrative:  As per H&P written by Dr. Carren Rang on 03/25/20 Susan Schmidt  is a 74 y.o. female, with history of osteoporosis, hyperlipidemia, diabetes mellitus, presents to the ED with a chief complaint of hip pain status post fall.  Patient was in her garden taking Bell peppers when she bent over, lost her balance, and fell down to her left side.  She landed on her left elbow and left hip.  Patient had immediate pain in her left hip and could not get up on her own.  Patient reports that she had no preceding symptoms to her fall, including no nausea vomiting, dizziness, chest pain, shortness of breath, palpitations, change in vision, change in hearing.  Patient reports that she did have palpitations and nausea when she was given morphine in the ED, but none prior to her fall.  In the recent days she has been in her normal state of health.  Patient has no complaints aside from her hip pain.  She reports that her hip pain is sharp, severe, worse with any movement of her hip, better with rest.  She has no associated numbness.  In the ED Dr. Jacqulyn Bath spoke with Dr. Tiburcio Pea who reported they will see patient tomorrow.  Patient may or may not be able to be worked into the surgical schedule tomorrow.  Patient will be n.p.o. after midnight.  Sisters at bedside and was given the opportunity to ask questions.  In the ED Temperature 97.9, pulse 79, blood pressure 155/64, satting at 98% on room air Leukocytosis of 16 Hypokalemia at 3.4 Hyperglycemia at 184 Covid test pending -patient is fully vaccinated for Covid Chest x-ray shows no acute intrathoracic process Patient is on a baby aspirin  Assessment & Plan: 1-Displaced Closed left hip fracture (HCC) -After mechanical fall -Revised cardiac risk index for preoperative risk is  3.9% -Continue to follow hip fracture protocol -Continue as needed analgesics and muscle relaxant -Orthopedic surgery has seen patient and planning for surgical intervention later today 03/27/2020 -Patient continued to be n.p.o. also surgery completed. -Stable vitamin D level and normal TSH. -Normal B12 level (585). -Physical therapy/Occupational Therapy to work with patient after surgical intervention.  2-acute urinary retention -Foley catheter has been placed -Assess voiding trials after surgical repair completed.  3-hypokalemia -4.2 after repletion. -Follow electrolytes trend and further replete as needed.  4-type 2 diabetes mellitus -Diet control as an outpatient -Most recent A1c 5.7 -Some elevation in her CBGs appreciated at time of admission most likely as a acute phase reactant from demargination stress. -Will use a sliding scale insulin while inpatient.  5-hyperlipidemia  -continue statins   DVT prophylaxis: SCDs Code Status: Full code Family Communication: No family at bedside.  Patient expressed that she will come to family member with update. 03/26/20 Disposition:   Status is: Inpatient  Dispo: The patient is from: Home              Anticipated d/c is to: To be determined              Anticipated d/c date is: 2-3 days; even longer if nursing facility placement require for rehabilitation after surgical intervention.              Patient currently no medically stable for discharge; still having ongoing pain and needing surgical  intervention to repair left hip fracture.  Follow orthopedic service recommendations; continue IV analgesics and supportive care.      Consultants:   Dr. Romeo AppleHarrison (orthopedic service.   Procedures:  -See below for x-ray reports -Left hip surgical intervention planned for later today 03/27/2020.   Antimicrobials: -None   Subjective: No fever, no chest pain, no nausea, no vomiting, no shortness of breath.  Still complaining of left  hip pain.  Reports to be ready for surgery.  Objective: Vitals:   03/27/20 1415 03/27/20 1430 03/27/20 1453 03/27/20 1630  BP: (!) 133/57 136/67 (!) 135/54   Pulse: 67 76 70   Resp: (!) 21 18 17 16   Temp:   97.7 F (36.5 C)   TempSrc:      SpO2: 100% 100% 99% 99%  Weight:      Height:        Intake/Output Summary (Last 24 hours) at 03/27/2020 1840 Last data filed at 03/27/2020 1439 Gross per 24 hour  Intake 4100 ml  Output 2845 ml  Net 1255 ml   Filed Weights   03/25/20 1938 03/26/20 2037  Weight: 50.3 kg 53.7 kg    Examination: General exam: Alert, awake, oriented x 3; complaining of pain in her left hip with movement.  No chest pain, no nausea, no vomiting, no shortness of breath. Respiratory system: Clear to auscultation. Respiratory effort normal. Cardiovascular system:RRR. No murmurs, rubs, gallops. Gastrointestinal system: Abdomen is nondistended, soft and nontender. No organomegaly or masses felt. Normal bowel sounds heard. Central nervous system: Alert and oriented. No focal neurological deficits. Extremities: No cyanosis or clubbing; left lower extremity shortened and externally rotated in comparison to the right.  Decreased range of motion secondary to pain appreciated. Skin: No rashes, no petechiae. Psychiatry: Judgement and insight appear normal. Mood & affect appropriate.     Data Reviewed: I have personally reviewed following labs and imaging studies  CBC: Recent Labs  Lab 03/25/20 2010 03/26/20 0541  WBC 16.0* 13.3*  NEUTROABS 13.3*  --   HGB 12.3 10.4*  HCT 35.9* 30.8*  MCV 93.0 93.9  PLT 180 172    Basic Metabolic Panel: Recent Labs  Lab 03/25/20 2010 03/26/20 0541  NA 132* 130*  K 3.4* 4.2  CL 98 98  CO2 24 26  GLUCOSE 184* 192*  BUN 11 10  CREATININE 0.59 0.57  CALCIUM 8.8* 8.7*  MG  --  1.7    GFR: Estimated Creatinine Clearance: 50.3 mL/min (by C-G formula based on SCr of 0.57 mg/dL).  Liver Function Tests: Recent Labs    Lab 03/26/20 0541  AST 20  ALT 14  ALKPHOS 38  BILITOT 0.7  PROT 5.9*  ALBUMIN 3.2*    CBG: Recent Labs  Lab 03/27/20 0106 03/27/20 0738 03/27/20 1107 03/27/20 1405 03/27/20 1635  GLUCAP 134* 123* 112* 119* 135*     Recent Results (from the past 240 hour(s))  SARS Coronavirus 2 by RT PCR (hospital order, performed in [facility] lab) Nasopharyngeal Nasopharyngeal Swab     Status: None   Collection Time: 03/25/20  8:11 PM   Specimen: Nasopharyngeal Swab  Result Value Ref Range Status   SARS Coronavirus 2 NEGATIVE NEGATIVE Final    Comment: (NOTE) SARS-CoV-2 target nucleic acids are NOT DETECTED.  The SARS-CoV-2 RNA is generally detectable in upper and lower respiratory specimens during the acute phase of infection. The lowest concentration of SARS-CoV-2 viral copies this assay can detect is 250 copies / mL. A negative result  does not preclude SARS-CoV-2 infection and should not be used as the sole basis for treatment or other patient management decisions.  A negative result may occur with improper specimen collection / handling, submission of specimen other than nasopharyngeal swab, presence of viral mutation(s) within the areas targeted by this assay, and inadequate number of viral copies (<250 copies / mL). A negative result must be combined with clinical observations, patient history, and epidemiological information.  Fact Sheet for Patients:   BoilerBrush.com.cy  Fact Sheet for Healthcare Providers: https://pope.com/  This test is not yet approved or  cleared by the [place] FDA and has been authorized for detection and/or diagnosis of SARS-CoV-2 by FDA under an Emergency Use Authorization (EUA).  This EUA will remain in effect (meaning this test can be used) for the duration of the COVID-19 declaration under Section 564(b)(1) of the Act, 21 U.S.C. section 360bbb-3(b)(1), unless the authorization  is terminated or revoked sooner.  Performed at [facility], [address], [place], [place] [address]   Surgical PCR screen     Status: None   Collection Time: 03/26/20  8:38 PM   Specimen: Nasal Mucosa; Nasal Swab  Result Value Ref Range Status   MRSA, PCR NEGATIVE NEGATIVE Final   Staphylococcus aureus NEGATIVE NEGATIVE Final    Comment: (NOTE) The Xpert SA Assay (FDA approved for NASAL specimens in patients 49 years of age and older), is one component of a comprehensive surveillance program. It is not intended to diagnose infection nor to guide or monitor treatment. Performed at [facility], [address], [place], [place] [address]      Radiology Studies: DG Chest Portable 1 View  Result Date: 03/25/2020 CLINICAL DATA:  Larey Seat, left hip fracture, preoperative evaluation, hypertension and diabetes, tobacco abuse EXAM: PORTABLE CHEST 1 VIEW COMPARISON:  None. FINDINGS: Single frontal view of the chest demonstrates an unremarkable cardiac silhouette. No airspace disease, effusion, or pneumothorax. There are no acute displaced fractures. IMPRESSION: 1. No acute intrathoracic process. Electronically Signed   By: Sharlet Salina M.D.   On: 03/25/2020 20:50   DG HIP OPERATIVE UNILAT WITH PELVIS LEFT  Result Date: 03/27/2020 CLINICAL DATA:  Left hip fracture EXAM: OPERATIVE  HIP (WITH PELVIS IF PERFORMED)  VIEWS TECHNIQUE: Fluoroscopic spot image(s) were submitted for interpretation post-operatively. COMPARISON:  None. FINDINGS: Intraop views of a left hip ORIF with intramedullary rod and screw fixation is seen. There are 9 intraop images. Fluoro time 1 minutes 20 seconds. IMPRESSION: Intraop views of ORIF of femur fracture with IM nail fixation. Electronically Signed   By: Jonna Clark M.D.   On: 03/27/2020 15:15   DG Hip Unilat W or Wo Pelvis 2-3 Views Left  Result Date: 03/25/2020 CLINICAL DATA:  74 year old female status post fall with left hip pain. EXAM: DG HIP (WITH OR WITHOUT  PELVIS) 2-3V LEFT COMPARISON:  None. FINDINGS: Highly comminuted and moderately impacted left femur intertrochanteric fracture. Displaced butterfly fragments. Left femoral head remains normally located. The pelvis appears to remain intact. Grossly intact proximal right femur. Negative lower abdominal and pelvic visceral contours. IMPRESSION: Highly comminuted left femur intertrochanteric fracture with varus impaction and displaced butterfly fragments. Electronically Signed   By: Odessa Fleming M.D.   On: 03/25/2020 20:14    Scheduled Meds:  [START ON 03/28/2020] aspirin EC  325 mg Oral Q breakfast   calcium-vitamin D  1 tablet Oral Daily   Chlorhexidine Gluconate Cloth  6 each Topical Daily   docusate sodium  100 mg Oral  BID   insulin aspart  0-9 Units Subcutaneous TID WC   mupirocin ointment  1 application Nasal BID   rosuvastatin  5 mg Oral Daily   traMADol  50 mg Oral Q6H   Continuous Infusions:  sodium chloride 75 mL/hr at 03/27/20 1501    ceFAZolin (ANCEF) IV 2 g (03/27/20 1755)   lactated ringers Stopped (03/27/20 1439)   methocarbamol (ROBAXIN) IV       LOS: 2 days    Time spent: 30 minutes    Vassie Loll, MD [facility]   To contact the attending provider between 7A-7P or the covering provider during after hours 7P-7A, please log into the web site [email or web address] and access using universal [place] password for that web site. If you do not have the password, please call the hospital operator.  03/27/2020, 6:40 PM

## 2020-03-27 NOTE — Transfer of Care (Signed)
Immediate Anesthesia Transfer of Care Note  Patient: [name]  Procedure(s) Performed: INTRAMEDULLARY (IM) NAIL INTERTROCHANTRIC (Left Hip)  Patient Location: PACU  Anesthesia Type:MAC and Spinal  Level of Consciousness: drowsy and patient cooperative  Airway & Oxygen Therapy: Patient Spontanous Breathing and Patient connected to nasal cannula oxygen  Post-op Assessment: Report given to RN and Post -op Vital signs reviewed and stable  Post vital signs: Reviewed and stable  Last Vitals:  Vitals Value Taken Time  BP 112/72   Temp    Pulse 70 03/27/20 1359  Resp 10 03/27/20 1359  SpO2 98 % 03/27/20 1359  Vitals shown include unvalidated device data.  Last Pain:  Vitals:   03/27/20 1112  TempSrc: Oral  PainSc: 2       Patients Stated Pain Goal: 6 (28/76/81 1572)  Complications: No complications documented.

## 2020-03-27 NOTE — Brief Op Note (Signed)
03/27/2020  1:55 PM  PATIENT:  Susan Schmidt  74 y.o. female  PRE-OPERATIVE DIAGNOSIS:  left hip fracture  POST-OPERATIVE DIAGNOSIS:  left hip fracture  PROCEDURE:  Procedure(s): INTRAMEDULLARY (IM) NAIL INTERTROCHANTRIC (Left)  SURGEON:  Surgeon(s) and Role:    Vickki Hearing, MD - Primary  IMPLANTS: Stryker gamma nail, short nail 11 x 188 mm x 825 degrees, 10.5 x 90 mm lag screw, 5 x 40 fully threaded distal locking screw with acorn proximal screw in sliding mode  PHYSICIAN ASSISTANT:   ASSISTANTS: cynthia wrenn   ANESTHESIA:   spinal  EBL:  20 mL   BLOOD ADMINISTERED:none  DRAINS: none   LOCAL MEDICATIONS USED:  MARCAINE     SPECIMEN:  No Specimen  DISPOSITION OF SPECIMEN:  N/A  COUNTS:  YES  TOURNIQUET:  * No tourniquets in log *  DICTATION: .Dragon Dictation  PLAN OF CARE: Admit to inpatient   PATIENT DISPOSITION:  PACU - hemodynamically stable.   Delay start of Pharmacological VTE agent (>24hrs) due to surgical blood loss or risk of bleeding: yes  Patient was seen in preop.  Reevaluation was performed including chart review patient was cleared for surgery.  Surgical site was confirmed and marked his left hip.  Patient was taken the operating for spinal anesthesia followed by 2 g of Ancef IV  A Foley catheter was placed she was then placed on the fracture table in the supine position.  The right leg was placed in a well leg holder with appropriate padding perineal post was padded the left leg was placed in a traction device  C-arm was brought in and the left lower extremity was manipulated with traction and internal rotation until a stable reduction was obtained.  The posterior medial buttress was intact  I chose 125 mm short nail which was opened and placed on the back table  The left leg was then prepped and draped sterilely followed by timeout which time x-rays were reviewed.  Implants had been checked prior to entering the operating  room.  Spinal needle was used to outline the proximal femur.  An incision was made over the greater trochanter extended proximally divided the subcutaneous tissue down to the fascia which was cleared with a sharp pelvic periosteal elevator.  The fascia was split in line with the skin incision until the greater trochanter was visualized.  Curved awl was used to enter the trochanter and passed into the femoral canal and confirmed by x-ray  Guidewire was then placed down the femoral canal through the cannulated awl the awl was removed and the cannulated reamer was passed over the guidewire down to the lesser trochanter  The nail was then passed over the guidewire and passed easily.  A second incision was made distal to the first incision fascia was split with the periosteal elevator and the cannula was advanced down to bone.  Lateral x-ray was taken to assess rotation.  Perforating drill bit was passed followed by threaded tip guidewire up until the subchondral bone with appropriate tip to apex distance  The guidepin measured 90 mm the triple reamer was set for 90 and passed over the guidewire.  The lag screw was then placed over the guidewire.  Proximal portion of the nail was irrigated and suctioned and the acorn was passed and confirmed to be engaged by twisting the screwdriver over the lag screw  We then turned our attention to the distal locking screw third incision was made the cannula was advanced  down to bone with a sharp trocar  We drilled with appropriate drill bit and measured off the drill bit a 40 mm screw was passed  Final imaging of all the hardware was performed and was found to be intact fracture had a good reduction with stable posterior medial buttress  Wounds were irrigated and closed proximally with #1 Braylon, 0 Monocryl and staples injecting 30 cc of Marcaine beneath the fascia  The other 2 wounds were closed with 0 Monocryl and staples  Along dressing was applied  Postop  plan  wbat Staples out pod 14  xrays in the office OCT 14  dvt prophylaxis for 35 days with aspirin

## 2020-03-27 NOTE — Progress Notes (Signed)
PT Cancellation Note  Patient Details Name: Susan Schmidt MRN: [id number] DOB: [birth year]   Cancelled Treatment:    Reason Eval/Treat Not Completed: Medical issues which prohibited therapy; Patient to have surgery, will need new PT order after surgical intervention.  7:46 AM, 03/27/20 Wyman Songster PT, DPT Physical Therapist at [facility]

## 2020-03-27 NOTE — Progress Notes (Signed)
Pt returned to room 413 via stretcher from PACU s/p ORIF left hip. DDI to left hip, ice pack in place. A&O x4. VSS. IVF infusing per order, no s/s infiltration right wrist IV site. SCD's on. Pt with good sensation, cap refill and movement of bilateral lower legs and feet. Denies c/o at this time.

## 2020-03-27 NOTE — Anesthesia Postprocedure Evaluation (Signed)
Anesthesia Post Note  Patient: Susan Schmidt  Procedure(s) Performed: INTRAMEDULLARY (IM) NAIL INTERTROCHANTRIC (Left Hip)  Patient location during evaluation: PACU Anesthesia Type: Spinal Level of consciousness: awake and alert, oriented and sedated Pain management: pain level controlled Vital Signs Assessment: post-procedure vital signs reviewed and stable Respiratory status: spontaneous breathing, respiratory function stable and patient connected to nasal cannula oxygen Cardiovascular status: blood pressure returned to baseline Postop Assessment: no apparent nausea or vomiting and spinal receding Anesthetic complications: no   No complications documented.   Last Vitals:  Vitals:   03/27/20 1415 03/27/20 1430  BP: (!) 133/57 136/67  Pulse: 67 76  Resp: (!) 21 18  Temp:    SpO2: 100% 100%    Last Pain:  Vitals:   03/27/20 1415  TempSrc:   PainSc: 0-No pain                 Osiah Haring C Hadley Soileau

## 2020-03-27 NOTE — Anesthesia Preprocedure Evaluation (Addendum)
Anesthesia Evaluation  Patient identified by MRN, date of birth, ID band Patient awake    Reviewed: Allergy & Precautions, NPO status , Patient's Chart, lab work & pertinent test results  History of Anesthesia Complications Negative for: history of anesthetic complications  Airway Mallampati: III  TM Distance: >3 FB Neck ROM: Full    Dental  (+) Dental Advisory Given, Teeth Intact   Pulmonary neg pulmonary ROS, former smoker,    Pulmonary exam normal breath sounds clear to auscultation       Cardiovascular Exercise Tolerance: Good hypertension, Pt. on medications Normal cardiovascular exam Rhythm:Regular Rate:Normal     Neuro/Psych negative psych ROS   GI/Hepatic   Endo/Other  diabetes, Well Controlled, Type 2  Renal/GU      Musculoskeletal negative musculoskeletal ROS (+)   Abdominal   Peds  Hematology negative hematology ROS (+)   Anesthesia Other Findings   Reproductive/Obstetrics negative OB ROS                            Anesthesia Physical Anesthesia Plan  ASA: III  Anesthesia Plan: Spinal and General   Post-op Pain Management:    Induction:   PONV Risk Score and Plan: Ondansetron  Airway Management Planned: Nasal Cannula, Natural Airway and Simple Face Mask  Additional Equipment:   Intra-op Plan:   Post-operative Plan:   Informed Consent: I have reviewed the patients History and Physical, chart, labs and discussed the procedure including the risks, benefits and alternatives for the proposed anesthesia with the patient or authorized representative who has indicated his/her understanding and acceptance.     Dental advisory given  Plan Discussed with: CRNA and Surgeon  Anesthesia Plan Comments:        Anesthesia Quick Evaluation

## 2020-03-27 NOTE — Progress Notes (Signed)
Pt taken to pre-op holding via stretcher by OR staff.

## 2020-03-27 NOTE — Anesthesia Procedure Notes (Signed)
Spinal  Patient location during procedure: OR Start time: 03/27/2020 12:17 PM End time: 03/27/2020 12:27 PM Staffing Performed: anesthesiologist  Anesthesiologist: Molli Barrows, MD Preanesthetic Checklist Completed: patient identified, IV checked, site marked, risks and benefits discussed, surgical consent, monitors and equipment checked, pre-op evaluation and timeout performed Spinal Block Patient position: right lateral decubitus Prep: Betadine Patient monitoring: heart rate, cardiac monitor, continuous pulse ox and blood pressure Approach: midline Location: L3-4 Injection technique: single-shot Needle Needle type: Sprotte and Spinocan  Needle gauge: 24 G Needle length: 9 cm Needle insertion depth: 4 cm Assessment Sensory level: T10

## 2020-03-28 DIAGNOSIS — D62 Acute posthemorrhagic anemia: Secondary | ICD-10-CM

## 2020-03-28 LAB — BASIC METABOLIC PANEL
Anion gap: 7 (ref 5–15)
BUN: 8 mg/dL (ref 8–23)
CO2: 25 mmol/L (ref 22–32)
Calcium: 8 mg/dL — ABNORMAL LOW (ref 8.9–10.3)
Chloride: 104 mmol/L (ref 98–111)
Creatinine, Ser: 0.53 mg/dL (ref 0.44–1.00)
GFR calc Af Amer: >60 mL/min (ref >60)
GFR calc non Af Amer: >60 mL/min (ref >60)
Glucose, Bld: 140 mg/dL — ABNORMAL HIGH (ref 70–99)
Potassium: 3.7 mmol/L (ref 3.5–5.1)
Sodium: 136 mmol/L (ref 135–145)

## 2020-03-28 LAB — GLUCOSE, CAPILLARY
Glucose-Capillary: 130 mg/dL — ABNORMAL HIGH (ref 70–99)
Glucose-Capillary: 110 mg/dL — ABNORMAL HIGH (ref 70–99)
Glucose-Capillary: 129 mg/dL — ABNORMAL HIGH (ref 70–99)
Glucose-Capillary: 136 mg/dL — ABNORMAL HIGH (ref 70–99)

## 2020-03-28 LAB — CBC
HCT: 22.6 % — ABNORMAL LOW (ref 36.0–46.0)
Hemoglobin: 7.6 g/dL — ABNORMAL LOW (ref 12.0–15.0)
MCH: 32.1 pg (ref 26.0–34.0)
MCHC: 33.6 g/dL (ref 30.0–36.0)
MCV: 95.4 fL (ref 80.0–100.0)
Platelets: 132 10*3/uL — ABNORMAL LOW (ref 150–400)
RBC: 2.37 MIL/uL — ABNORMAL LOW (ref 3.87–5.11)
RDW: 12.5 % (ref 11.5–15.5)
WBC: 9.2 10*3/uL (ref 4.0–10.5)
nRBC: 0 % (ref 0.0–0.2)

## 2020-03-28 MED ORDER — POLYSACCHARIDE IRON COMPLEX 150 MG PO CAPS
150.0000 mg | ORAL_CAPSULE | Freq: Two times a day (BID) | ORAL | Status: DC
  Administered 2020-03-28 – 2020-03-30 (×5): 150 mg via ORAL
  Filled 2020-03-28 (×5): qty 1

## 2020-03-28 NOTE — Plan of Care (Signed)
  Problem: Education: Goal: Knowledge of General Education information will improve Description: Including pain rating scale, medication(s)/side effects and non-pharmacologic comfort measures Outcome: Progressing   Problem: Clinical Measurements: Goal: Ability to maintain clinical measurements within normal limits will improve Outcome: Progressing   Problem: Clinical Measurements: Goal: Will remain free from infection Outcome: Progressing   Problem: Nutrition: Goal: Adequate nutrition will be maintained Outcome: Progressing   

## 2020-03-28 NOTE — Plan of Care (Signed)

## 2020-03-28 NOTE — Progress Notes (Signed)
PROGRESS NOTE    Susan Schmidt  OFB:[id number] DOB: 17-Jul-1945 DOA: 03/25/2020 PCP: Shirline Frees, NP    Chief Complaint  Patient presents with  . Fall    Brief Narrative:  As per H&P written by Dr. Carren Rang on 03/25/20 Susan Schmidt  is a 74 y.o. female, with history of osteoporosis, hyperlipidemia, diabetes mellitus, presents to the ED with a chief complaint of hip pain status post fall.  Patient was in her garden taking Bell peppers when she bent over, lost her balance, and fell down to her left side.  She landed on her left elbow and left hip.  Patient had immediate pain in her left hip and could not get up on her own.  Patient reports that she had no preceding symptoms to her fall, including no nausea vomiting, dizziness, chest pain, shortness of breath, palpitations, change in vision, change in hearing.  Patient reports that she did have palpitations and nausea when she was given morphine in the ED, but none prior to her fall.  In the recent days she has been in her normal state of health.  Patient has no complaints aside from her hip pain.  She reports that her hip pain is sharp, severe, worse with any movement of her hip, better with rest.  She has no associated numbness.  In the ED Dr. Jacqulyn Bath spoke with Dr. Tiburcio Pea who reported they will see patient tomorrow.  Patient may or may not be able to be worked into the surgical schedule tomorrow.  Patient will be n.p.o. after midnight.  Sisters at bedside and was given the opportunity to ask questions.  In the ED Temperature 97.9, pulse 79, blood pressure 155/64, satting at 98% on room air Leukocytosis of 16 Hypokalemia at 3.4 Hyperglycemia at 184 Covid test pending -patient is fully vaccinated for Covid Chest x-ray shows no acute intrathoracic process Patient is on a baby aspirin  Assessment & Plan: 1-Displaced Closed left hip fracture (HCC) -After mechanical fall; no prodrome extensions or concerns for acute  infection. -Continue to follow hip fracture protocol -Continue as needed analgesics and muscle relaxant -Orthopedic surgery performed surgical repair on 03/27/2020; so far well-tolerated. -Plan is for weightbearing as tolerated, staples to be removed on postoperative day 14; patient will require x-rays in the office in 2 weeks. -DVT prophylaxis for 35 days using aspirin. -Stable vitamin D level and normal TSH. -Normal B12 level (585). -Physical therapy/Occupational Therapy pending to see patient; anticipate need for skilled nursing facility for rehab at discharge.  2-acute urinary retention -Foley catheter was placed -Planning to discontinue Foley catheter and assist ability to urinate on her own.  3-hypokalemia -Follow electrolytes trend and further replete as needed.  4-type 2 diabetes mellitus -Diet control as an outpatient -Most recent A1c 5.7 -Some elevation in her CBGs appreciated at time of admission most likely as a acute phase reactant from demargination stress. -Will use a sliding scale insulin while inpatient.  5-hyperlipidemia  -continue statins  6-leukocytosis -No signs of acute infection -Appears to be a stress demargination -WBC is back to normal after fluid resuscitation.  7-acute postoperative blood loss -Hemoglobin down to 7.6 -No transfusion needed currently -Patient started on Niferex twice a day -Continue to follow hemoglobin trend.   DVT prophylaxis: SCDs Code Status: Full code Family Communication: No family at bedside.  Patient expressed that she will come to family member with update. 03/26/20 Disposition:   Status is: Inpatient  Dispo: The patient is from: Home  Anticipated d/c is to: To be determined              Anticipated d/c date is: 2 days; even longer if nursing facility placement require for rehabilitation after surgical intervention.              Patient currently no medically stable for discharge; having some pain in her  left hip after surgical intervention; decreased in her hemoglobin appreciated, but no requiring transfusion yet.  Will need skilled nursing facility for rehabilitation after discharge.  Anticipate achieving medical stability in the next 1 to 2 days.      Consultants:   Dr. Romeo Apple (orthopedic service.   Procedures:  -See below for x-ray reports -Left hip surgical intervention performed on 03/27/2020.   Antimicrobials: -Ancef x2 doses for surgical prophylaxis.    Subjective: No fever, no chest pain, no nausea, no vomiting.  Still complaining of left hip pain; but controlled with analgesics. Objective: Vitals:   03/27/20 1730 03/27/20 2158 03/28/20 0510 03/28/20 1500  BP:  (!) 121/53 115/62 101/60  Pulse:  89 89 88  Resp: 18 17 16 18   Temp:  100.1 F (37.8 C) 98.8 F (37.1 C) 99.8 F (37.7 C)  TempSrc:    Oral  SpO2: 98% 98% 96% 97%  Weight:      Height:        Intake/Output Summary (Last 24 hours) at 03/28/2020 1701 Last data filed at 03/28/2020 0900 Gross per 24 hour  Intake 1228.73 ml  Output 600 ml  Net 628.73 ml   Filed Weights   03/25/20 1938 03/26/20 2037  Weight: 50.3 kg 53.7 kg    Examination: General exam: Alert, awake, oriented x 3, reports left hip pain control with analgesics.  No chest pain, no nausea, no vomiting.  Good oxygen saturation on room air. Respiratory system: Clear to auscultation. Respiratory effort normal. Cardiovascular system:RRR. No murmurs, rubs, gallops. Gastrointestinal system: Abdomen is nondistended, soft and nontender. No organomegaly or masses felt. Normal bowel sounds heard. Central nervous system: Alert and oriented. No focal neurological deficits. Extremities: No cyanosis or clubbing; left hip incision appears to be clean, dry and intact. Skin: No rashes, no petechiae. Psychiatry: Judgement and insight appear normal. Mood & affect appropriate.   Data Reviewed: I have personally reviewed following labs and imaging  studies  CBC: Recent Labs  Lab 03/25/20 2010 03/26/20 0541 03/28/20 0716  WBC 16.0* 13.3* 9.2  NEUTROABS 13.3*  --   --   HGB 12.3 10.4* 7.6*  HCT 35.9* 30.8* 22.6*  MCV 93.0 93.9 95.4  PLT 180 172 132*    Basic Metabolic Panel: Recent Labs  Lab 03/25/20 2010 03/26/20 0541 03/28/20 0716  NA 132* 130* 136  K 3.4* 4.2 3.7  CL 98 98 104  CO2 24 26 25   GLUCOSE 184* 192* 140*  BUN 11 10 8   CREATININE 0.59 0.57 0.53  CALCIUM 8.8* 8.7* 8.0*  MG  --  1.7  --     GFR: Estimated Creatinine Clearance: 50.3 mL/min (by C-G formula based on SCr of 0.53 mg/dL).  Liver Function Tests: Recent Labs  Lab 03/26/20 0541  AST 20  ALT 14  ALKPHOS 38  BILITOT 0.7  PROT 5.9*  ALBUMIN 3.2*    CBG: Recent Labs  Lab 03/27/20 1635 03/27/20 2118 03/28/20 0756 03/28/20 1221 03/28/20 1617  GLUCAP 135* 152* 130* 110* 129*     Recent Results (from the past 240 hour(s))  SARS Coronavirus 2 by RT PCR (hospital  order, performed in [facility] hospital lab) Nasopharyngeal Nasopharyngeal Swab     Status: None   Collection Time: 03/25/20  8:11 PM   Specimen: Nasopharyngeal Swab  Result Value Ref Range Status   SARS Coronavirus 2 NEGATIVE NEGATIVE Final    Comment: (NOTE) SARS-CoV-2 target nucleic acids are NOT DETECTED.  The SARS-CoV-2 RNA is generally detectable in upper and lower respiratory specimens during the acute phase of infection. The lowest concentration of SARS-CoV-2 viral copies this assay can detect is 250 copies / mL. A negative result does not preclude SARS-CoV-2 infection and should not be used as the sole basis for treatment or other patient management decisions.  A negative result may occur with improper specimen collection / handling, submission of specimen other than nasopharyngeal swab, presence of viral mutation(s) within the areas targeted by this assay, and inadequate number of viral copies (<250 copies / mL). A negative result must be combined with  clinical observations, patient history, and epidemiological information.  Fact Sheet for Patients:   BoilerBrush.com.cyhttps://www.fda.gov/media/136312/download  Fact Sheet for Healthcare Providers: https://pope.com/https://www.fda.gov/media/136313/download  This test is not yet approved or  cleared by the [place] FDA and has been authorized for detection and/or diagnosis of SARS-CoV-2 by FDA under an Emergency Use Authorization (EUA).  This EUA will remain in effect (meaning this test can be used) for the duration of the COVID-19 declaration under Section 564(b)(1) of the Act, 21 U.S.C. section 360bbb-3(b)(1), unless the authorization is terminated or revoked sooner.  Performed at [facility], [address] [place], [place] [address]   Surgical PCR screen     Status: None   Collection Time: 03/26/20  8:38 PM   Specimen: Nasal Mucosa; Nasal Swab  Result Value Ref Range Status   MRSA, PCR NEGATIVE NEGATIVE Final   Staphylococcus aureus NEGATIVE NEGATIVE Final    Comment: (NOTE) The Xpert SA Assay (FDA approved for NASAL specimens in patients 75 years of age and older), is one component of a comprehensive surveillance program. It is not intended to diagnose infection nor to guide or monitor treatment. Performed at [facility], [address] [place], [place] [address]      Radiology Studies: DG HIP OPERATIVE UNILAT WITH PELVIS LEFT  Result Date: 03/27/2020 CLINICAL DATA:  Left hip fracture EXAM: OPERATIVE  HIP (WITH PELVIS IF PERFORMED)  VIEWS TECHNIQUE: Fluoroscopic spot image(s) were submitted for interpretation post-operatively. COMPARISON:  None. FINDINGS: Intraop views of a left hip ORIF with intramedullary rod and screw fixation is seen. There are 9 intraop images. Fluoro time 1 minutes 20 seconds. IMPRESSION: Intraop views of ORIF of femur fracture with IM nail fixation. Electronically Signed   By: Jonna ClarkBindu  Avutu M.D.   On: 03/27/2020 15:15    Scheduled Meds: . aspirin EC  325 mg Oral Q breakfast   . calcium-vitamin D  1 tablet Oral Daily  . Chlorhexidine Gluconate Cloth  6 each Topical Daily  . docusate sodium  100 mg Oral BID  . insulin aspart  0-9 Units Subcutaneous TID WC  . iron polysaccharides  150 mg Oral BID  . rosuvastatin  5 mg Oral Daily  . traMADol  50 mg Oral Q6H   Continuous Infusions: . sodium chloride 75 mL/hr at 03/27/20 2129  . lactated ringers Stopped (03/27/20 1439)  . methocarbamol (ROBAXIN) IV       LOS: 3 days    Time spent: 30 minutes    Vassie Lollarlos Ayako Tapanes, MD [facility]   To contact the attending provider between  7A-7P or the covering provider during after hours 7P-7A, please log into the web site [email or web address] and access using universal [place] password for that web site. If you do not have the password, please call the hospital operator.  03/28/2020, 5:01 PM

## 2020-03-29 LAB — CBC
HCT: 21.4 % — ABNORMAL LOW (ref 36.0–46.0)
Hemoglobin: 7.4 g/dL — ABNORMAL LOW (ref 12.0–15.0)
MCH: 33 pg (ref 26.0–34.0)
MCHC: 34.6 g/dL (ref 30.0–36.0)
MCV: 95.5 fL (ref 80.0–100.0)
Platelets: 156 10*3/uL (ref 150–400)
RBC: 2.24 MIL/uL — ABNORMAL LOW (ref 3.87–5.11)
RDW: 12.4 % (ref 11.5–15.5)
WBC: 8.8 10*3/uL (ref 4.0–10.5)
nRBC: 0 % (ref 0.0–0.2)

## 2020-03-29 LAB — GLUCOSE, CAPILLARY
Glucose-Capillary: 116 mg/dL — ABNORMAL HIGH (ref 70–99)
Glucose-Capillary: 186 mg/dL — ABNORMAL HIGH (ref 70–99)
Glucose-Capillary: 137 mg/dL — ABNORMAL HIGH (ref 70–99)
Glucose-Capillary: 169 mg/dL — ABNORMAL HIGH (ref 70–99)

## 2020-03-29 LAB — BASIC METABOLIC PANEL
Anion gap: 6 (ref 5–15)
BUN: 6 mg/dL — ABNORMAL LOW (ref 8–23)
CO2: 26 mmol/L (ref 22–32)
Calcium: 8.3 mg/dL — ABNORMAL LOW (ref 8.9–10.3)
Chloride: 101 mmol/L (ref 98–111)
Creatinine, Ser: 0.43 mg/dL — ABNORMAL LOW (ref 0.44–1.00)
GFR calc Af Amer: >60 mL/min (ref >60)
GFR calc non Af Amer: >60 mL/min (ref >60)
Glucose, Bld: 132 mg/dL — ABNORMAL HIGH (ref 70–99)
Potassium: 3.6 mmol/L (ref 3.5–5.1)
Sodium: 133 mmol/L — ABNORMAL LOW (ref 135–145)

## 2020-03-29 NOTE — Progress Notes (Signed)
PROGRESS NOTE    Susan Schmidt  XTA:[id number] DOB: [birth year] DOA: 03/25/2020 PCP: Shirline Frees, NP    Chief Complaint  Patient presents with  . Fall    Brief Narrative:  As per H&P written by Dr. Carren Rang on 03/25/20 Susan Schmidt  is a 74 y.o. female, with history of osteoporosis, hyperlipidemia, diabetes mellitus, presents to the ED with a chief complaint of hip pain status post fall.  Patient was in her garden taking Bell peppers when she bent over, lost her balance, and fell down to her left side.  She landed on her left elbow and left hip.  Patient had immediate pain in her left hip and could not get up on her own.  Patient reports that she had no preceding symptoms to her fall, including no nausea vomiting, dizziness, chest pain, shortness of breath, palpitations, change in vision, change in hearing.  Patient reports that she did have palpitations and nausea when she was given morphine in the ED, but none prior to her fall.  In the recent days she has been in her normal state of health.  Patient has no complaints aside from her hip pain.  She reports that her hip pain is sharp, severe, worse with any movement of her hip, better with rest.  She has no associated numbness.  In the ED Dr. Jacqulyn Bath spoke with Dr. Tiburcio Pea who reported they will see patient tomorrow.  Patient may or may not be able to be worked into the surgical schedule tomorrow.  Patient will be n.p.o. after midnight.  Sisters at bedside and was given the opportunity to ask questions.  In the ED Temperature 97.9, pulse 79, blood pressure 155/64, satting at 98% on room air Leukocytosis of 16 Hypokalemia at 3.4 Hyperglycemia at 184 Covid test pending -patient is fully vaccinated for Covid Chest x-ray shows no acute intrathoracic process Patient is on a baby aspirin  Assessment & Plan: 1-Displaced Closed left hip fracture (HCC) -After mechanical fall; no prodrome extensions or concerns for acute  infection. -Continue to follow hip fracture protocol -Continue as needed analgesics and muscle relaxant -Orthopedic surgery performed surgical repair on 03/27/2020; so far well-tolerated. -Plan is for weightbearing as tolerated, staples to be removed on postoperative day 14; patient will require x-rays in the office in 2 weeks. -DVT prophylaxis for 35 days using aspirin. -Stable vitamin D level and normal TSH. -Normal B12 level (585). -Physical therapy/Occupational Therapy pending to see patient; anticipate need for skilled nursing facility for rehab at discharge.  2-acute urinary retention -Foley catheter was placed -Planning to discontinue Foley catheter and assist ability to urinate on her own.  3-hypokalemia -Follow electrolytes trend and further replete as needed.  4-type 2 diabetes mellitus -Diet control as an outpatient -Most recent A1c 5.7 -Some elevation in her CBGs appreciated at time of admission most likely as a acute phase reactant from demargination stress. -Will use a sliding scale insulin while inpatient.  5-hyperlipidemia  -continue statins  6-leukocytosis -No signs of acute infection -Appears to be a stress demargination -WBC is back to normal after fluid resuscitation.  7-acute postoperative blood loss -Hemoglobin down to 7.4 -No transfusion needed currently -Patient started on Niferex twice a day -Continue to follow hemoglobin trend.   DVT prophylaxis: SCDs Code Status: Full code Family Communication: No family at bedside.  Patient expressed that she will come to family member with update. 03/26/20 Disposition:   Status is: Inpatient  Dispo: The patient is from: Home  Anticipated d/c is to: To be determined              Anticipated d/c date is: 2 days; even longer if nursing facility placement require for rehabilitation after surgical intervention.              Patient currently medically stable for discharge to skilled nursing facility  for rehabilitation.  Reports having some pain in her left hip after surgical intervention, but will control with current analgesics. Decreased hemoglobin level appreciated, has remained stable and planning preparation at this time.  Continue to follow postoperative recommendations by orthopedic service.      Consultants:   Dr. Romeo Apple (orthopedic service.   Procedures:  -See below for x-ray reports -Left hip surgical intervention performed on 03/27/2020.   Antimicrobials: -Ancef x2 doses for surgical prophylaxis.    Subjective: No chest pain, no nausea, no vomiting.  Reports some pain with movement of her left hip but well controlled with current analgesics.  Objective: Vitals:   03/28/20 1500 03/28/20 2055 03/29/20 0400 03/29/20 1402  BP: 101/60 (!) 115/50 (!) 127/58 (!) 113/55  Pulse: 88 91 90 92  Resp: 18 20 16 18   Temp: 99.8 F (37.7 C) 97.8 F (36.6 C) 98.4 F (36.9 C) 98 F (36.7 C)  TempSrc: Oral Oral Oral   SpO2: 97% 95% 98% 97%  Weight:      Height:        Intake/Output Summary (Last 24 hours) at 03/29/2020 1725 Last data filed at 03/29/2020 1650 Gross per 24 hour  Intake 1590 ml  Output 2550 ml  Net -960 ml   Filed Weights   03/25/20 1938 03/26/20 2037  Weight: 50.3 kg 53.7 kg    Examination: General exam: Alert, awake, oriented x 3; no chest pain, no nausea, no vomiting.  Good control pain with current analgesics (movement makes pain worse).  Good oxygen saturation on room air.  No major distress reports feeling weak and tired.  Requiring 2 assistance by nurses overnight for mobilization. Respiratory system: Clear to auscultation. Respiratory effort normal. Cardiovascular system:RRR. No murmurs, rubs, gallops. Gastrointestinal system: Abdomen is nondistended, soft and nontender. No organomegaly or masses felt. Normal bowel sounds heard. Central nervous system: Alert and oriented. No focal neurological deficits. Extremities: No cyanosis or clubbing.   Left hip wound clean, dry and intact. Skin: No rashes, no petechiae Psychiatry: Judgement and insight appear normal. Mood & affect appropriate.    Data Reviewed: I have personally reviewed following labs and imaging studies  CBC: Recent Labs  Lab 03/25/20 2010 03/26/20 0541 03/28/20 0716 03/29/20 0722  WBC 16.0* 13.3* 9.2 8.8  NEUTROABS 13.3*  --   --   --   HGB 12.3 10.4* 7.6* 7.4*  HCT 35.9* 30.8* 22.6* 21.4*  MCV 93.0 93.9 95.4 95.5  PLT 180 172 132* 156    Basic Metabolic Panel: Recent Labs  Lab 03/25/20 2010 03/26/20 0541 03/28/20 0716 03/29/20 0722  NA 132* 130* 136 133*  K 3.4* 4.2 3.7 3.6  CL 98 98 104 101  CO2 24 26 25 26   GLUCOSE 184* 192* 140* 132*  BUN 11 10 8  6*  CREATININE 0.59 0.57 0.53 0.43*  CALCIUM 8.8* 8.7* 8.0* 8.3*  MG  --  1.7  --   --     GFR: Estimated Creatinine Clearance: 50.3 mL/min (A) (by C-G formula based on SCr of 0.43 mg/dL (L)).  Liver Function Tests: Recent Labs  Lab 03/26/20 0541  AST 20  ALT  14  ALKPHOS 38  BILITOT 0.7  PROT 5.9*  ALBUMIN 3.2*    CBG: Recent Labs  Lab 03/28/20 1617 03/28/20 2057 03/29/20 0739 03/29/20 1106 03/29/20 1627  GLUCAP 129* 136* 116* 186* 137*     Recent Results (from the past 240 hour(s))  SARS Coronavirus 2 by RT PCR (hospital order, performed in [facility] hospital lab) Nasopharyngeal Nasopharyngeal Swab     Status: None   Collection Time: 03/25/20  8:11 PM   Specimen: Nasopharyngeal Swab  Result Value Ref Range Status   SARS Coronavirus 2 NEGATIVE NEGATIVE Final    Comment: (NOTE) SARS-CoV-2 target nucleic acids are NOT DETECTED.  The SARS-CoV-2 RNA is generally detectable in upper and lower respiratory specimens during the acute phase of infection. The lowest concentration of SARS-CoV-2 viral copies this assay can detect is 250 copies / mL. A negative result does not preclude SARS-CoV-2 infection and should not be used as the sole basis for treatment or other patient  management decisions.  A negative result may occur with improper specimen collection / handling, submission of specimen other than nasopharyngeal swab, presence of viral mutation(s) within the areas targeted by this assay, and inadequate number of viral copies (<250 copies / mL). A negative result must be combined with clinical observations, patient history, and epidemiological information.  Fact Sheet for Patients:   BoilerBrush.com.cy  Fact Sheet for Healthcare Providers: https://pope.com/  This test is not yet approved or  cleared by the [place] FDA and has been authorized for detection and/or diagnosis of SARS-CoV-2 by FDA under an Emergency Use Authorization (EUA).  This EUA will remain in effect (meaning this test can be used) for the duration of the COVID-19 declaration under Section 564(b)(1) of the Act, 21 U.S.C. section 360bbb-3(b)(1), unless the authorization is terminated or revoked sooner.  Performed at [facility], [address], [place], [place] [address]   Surgical PCR screen     Status: None   Collection Time: 03/26/20  8:38 PM   Specimen: Nasal Mucosa; Nasal Swab  Result Value Ref Range Status   MRSA, PCR NEGATIVE NEGATIVE Final   Staphylococcus aureus NEGATIVE NEGATIVE Final    Comment: (NOTE) The Xpert SA Assay (FDA approved for NASAL specimens in patients 88 years of age and older), is one component of a comprehensive surveillance program. It is not intended to diagnose infection nor to guide or monitor treatment. Performed at [facility], [address], [place], [place] [address]      Radiology Studies: No results found.  Scheduled Meds: . aspirin EC  325 mg Oral Q breakfast  . calcium-vitamin D  1 tablet Oral Daily  . Chlorhexidine Gluconate Cloth  6 each Topical Daily  . docusate sodium  100 mg Oral BID  . insulin aspart  0-9 Units Subcutaneous TID WC  . iron polysaccharides  150 mg Oral BID    . rosuvastatin  5 mg Oral Daily  . traMADol  50 mg Oral Q6H   Continuous Infusions: . sodium chloride 50 mL/hr at 03/28/20 1755  . lactated ringers Stopped (03/27/20 1439)  . methocarbamol (ROBAXIN) IV       LOS: 4 days    Time spent: 30 minutes    Vassie Loll, MD [facility]   To contact the attending provider between 7A-7P or the covering provider during after hours 7P-7A, please log into the web site [email or web address] and access using universal [place] password for that web site. If you do not have the  password, please call the hospital operator.  03/29/2020, 5:25 PM

## 2020-03-29 NOTE — Progress Notes (Signed)
Pt was assisted to sit on side of bed this shift. Pt was helped by this nurse and the help of the Nurse Tech. Pt tolerated mobility skill well with a pain level of 7 on scale 1-10 after sitting on the side of the bed for 15 minutes. Pt is stable at this time and lying back in bed with SCD's applied.

## 2020-03-30 ENCOUNTER — Encounter (HOSPITAL_COMMUNITY): Payer: Self-pay | Admitting: Orthopedic Surgery

## 2020-03-30 DIAGNOSIS — Z7401 Bed confinement status: Secondary | ICD-10-CM | POA: Diagnosis not present

## 2020-03-30 DIAGNOSIS — T699XXA Effect of reduced temperature, unspecified, initial encounter: Secondary | ICD-10-CM | POA: Diagnosis not present

## 2020-03-30 DIAGNOSIS — E119 Type 2 diabetes mellitus without complications: Secondary | ICD-10-CM

## 2020-03-30 DIAGNOSIS — Z743 Need for continuous supervision: Secondary | ICD-10-CM | POA: Diagnosis not present

## 2020-03-30 DIAGNOSIS — E1169 Type 2 diabetes mellitus with other specified complication: Secondary | ICD-10-CM | POA: Diagnosis not present

## 2020-03-30 DIAGNOSIS — E876 Hypokalemia: Secondary | ICD-10-CM | POA: Diagnosis not present

## 2020-03-30 DIAGNOSIS — S72142A Displaced intertrochanteric fracture of left femur, initial encounter for closed fracture: Secondary | ICD-10-CM | POA: Diagnosis not present

## 2020-03-30 DIAGNOSIS — D649 Anemia, unspecified: Secondary | ICD-10-CM | POA: Diagnosis not present

## 2020-03-30 DIAGNOSIS — Z9181 History of falling: Secondary | ICD-10-CM | POA: Diagnosis not present

## 2020-03-30 DIAGNOSIS — R2689 Other abnormalities of gait and mobility: Secondary | ICD-10-CM | POA: Diagnosis not present

## 2020-03-30 DIAGNOSIS — Y92017 Garden or yard in single-family (private) house as the place of occurrence of the external cause: Secondary | ICD-10-CM | POA: Diagnosis not present

## 2020-03-30 DIAGNOSIS — R2681 Unsteadiness on feet: Secondary | ICD-10-CM | POA: Diagnosis not present

## 2020-03-30 DIAGNOSIS — W19XXXA Unspecified fall, initial encounter: Secondary | ICD-10-CM

## 2020-03-30 DIAGNOSIS — M6281 Muscle weakness (generalized): Secondary | ICD-10-CM | POA: Diagnosis not present

## 2020-03-30 DIAGNOSIS — E785 Hyperlipidemia, unspecified: Secondary | ICD-10-CM | POA: Diagnosis not present

## 2020-03-30 DIAGNOSIS — S72002D Fracture of unspecified part of neck of left femur, subsequent encounter for closed fracture with routine healing: Secondary | ICD-10-CM | POA: Diagnosis not present

## 2020-03-30 DIAGNOSIS — D62 Acute posthemorrhagic anemia: Secondary | ICD-10-CM | POA: Diagnosis not present

## 2020-03-30 DIAGNOSIS — S72002A Fracture of unspecified part of neck of left femur, initial encounter for closed fracture: Secondary | ICD-10-CM | POA: Diagnosis not present

## 2020-03-30 DIAGNOSIS — S72142D Displaced intertrochanteric fracture of left femur, subsequent encounter for closed fracture with routine healing: Secondary | ICD-10-CM | POA: Diagnosis not present

## 2020-03-30 LAB — GLUCOSE, CAPILLARY
Glucose-Capillary: 135 mg/dL — ABNORMAL HIGH (ref 70–99)
Glucose-Capillary: 143 mg/dL — ABNORMAL HIGH (ref 70–99)
Glucose-Capillary: 143 mg/dL — ABNORMAL HIGH (ref 70–99)

## 2020-03-30 LAB — CBC
HCT: 20.6 % — ABNORMAL LOW (ref 36.0–46.0)
Hemoglobin: 6.9 g/dL — CL (ref 12.0–15.0)
MCH: 32.1 pg (ref 26.0–34.0)
MCHC: 33.5 g/dL (ref 30.0–36.0)
MCV: 95.8 fL (ref 80.0–100.0)
Platelets: 174 10*3/uL (ref 150–400)
RBC: 2.15 MIL/uL — ABNORMAL LOW (ref 3.87–5.11)
RDW: 12.2 % (ref 11.5–15.5)
WBC: 7.5 10*3/uL (ref 4.0–10.5)
nRBC: 0 % (ref 0.0–0.2)

## 2020-03-30 LAB — SARS CORONAVIRUS 2 BY RT PCR (HOSPITAL ORDER, PERFORMED IN ~~LOC~~ HOSPITAL LAB): SARS Coronavirus 2: NEGATIVE

## 2020-03-30 LAB — PREPARE RBC (CROSSMATCH)

## 2020-03-30 MED ORDER — ASPIRIN 325 MG PO TBEC: 325.0000 mg | DELAYED_RELEASE_TABLET | Freq: Every day | ORAL | Status: AC

## 2020-03-30 MED ORDER — METHOCARBAMOL 500 MG PO TABS: 500.0000 mg | ORAL_TABLET | Freq: Three times a day (TID) | ORAL | Status: DC | PRN

## 2020-03-30 MED ORDER — ACETAMINOPHEN 325 MG PO TABS: 650.0000 mg | ORAL_TABLET | Freq: Four times a day (QID) | ORAL | Status: AC | PRN

## 2020-03-30 MED ORDER — POLYETHYLENE GLYCOL 3350 17 G PO PACK: 17.0000 g | PACK | Freq: Every day | ORAL | 0 refills | Status: DC | PRN

## 2020-03-30 MED ORDER — SODIUM CHLORIDE 0.9% IV SOLUTION: Freq: Once | INTRAVENOUS | Status: AC

## 2020-03-30 MED ORDER — TRAMADOL HCL 50 MG PO TABS
50.0000 mg | ORAL_TABLET | Freq: Three times a day (TID) | ORAL | 0 refills | Status: DC | PRN
Start: 2020-03-30 — End: 2020-05-18

## 2020-03-30 MED ORDER — DOCUSATE SODIUM 100 MG PO CAPS: 100.0000 mg | ORAL_CAPSULE | Freq: Two times a day (BID) | ORAL | 0 refills | Status: DC

## 2020-03-30 MED ORDER — POLYSACCHARIDE IRON COMPLEX 150 MG PO CAPS: 150.0000 mg | ORAL_CAPSULE | Freq: Two times a day (BID) | ORAL | Status: DC

## 2020-03-30 NOTE — TOC Transition Note (Signed)
Transition of Care [facility]) - CM/SW Discharge Note   Patient Details  Name: Susan Schmidt MRN: [id number] Date of Birth: [birth year]  [facility] CM[facility]SW Contact:  Leitha Bleak, RN Phone Number: 03/30/2020, 2:07 PM   Clinical Narrative:   Patient will be medically ready after transfusion today to go to SNF. TOC spoke with patient, she is accepting bed offer at [facility]. English as a second language teacher started. Ref # [id number], clinical to be faxed. Medical necessity filled out. RN/MD updated patient needs a new COVID test.    Final next level of care: Skilled Nursing Facility Barriers to Discharge: Barriers Resolved   Patient Goals and CMS Choice Patient states their goals for this hospitalization and ongoing recovery are:: to go to SNF CMS Medicare.gov Compare Post Acute Care list provided to:: Patient Choice offered to / list presented to : Patient  Discharge Placement      [place]

## 2020-03-30 NOTE — Discharge Summary (Signed)
Physician Discharge Summary  Susan Schmidt YJE:[id number] DOB: 05-06-1946 DOA: 03/25/2020  PCP: Susan Peng, NP  Admit date: 03/25/2020 Discharge date: 03/30/2020  Time spent: 35 minutes  Recommendations for Outpatient Follow-up:  1. Repeat CBC to follow hemoglobin and stability (in 1 week). 2. In 1 week repeat Basic metabolic panel electrolytes renal function.   Discharge Diagnoses:  Active Problems:   Type 2 diabetes mellitus without complication, without long-term current use of insulin (HCC)   Closed left hip fracture (HCC)   Fall   Acute blood loss as cause of postoperative anemia   Discharge Condition: Stable and improved.  Discharged to skilled nursing facility with instruction to follow-up with orthopedic service in 2 weeks.  CODE STATUS: Full code.  Diet recommendation: Modified carbohydrate diet.  Filed Weights   03/25/20 1938 03/26/20 2037  Weight: 50.3 kg 53.7 kg    History of present illness:  As per H&P written by Dr. Clearence Ped on 03/25/20 [name],with history of osteoporosis, hyperlipidemia, diabetes mellitus, presents to the ED with a chief complaint of hip pain status post fall. Patient was in her garden taking Susan Schmidt when she bent over, lost her balance, and fell down to her left side. She landed on her left elbow and left hip. Patient had immediate pain in her left hip and could not get up on her own. Patient reports that she had no precedingsymptoms to her fall, including no nausea vomiting, dizziness, chest pain, shortness of breath, palpitations, change in vision, change in hearing. Patient reports that she did have palpitations and nausea when she was given morphine in the ED, but none prior to her fall. In the recent days she has been in her normal state of health. Patient has no complaints aside from her hip pain. She reports that her hip pain is sharp, severe, worse with any movement of her hip,  better with rest. She has no associated numbness.  In the ED Dr. Laverta Baltimore spoke with Dr. Kenton Kingfisher who reported they will see patient tomorrow. Patient may or may not be able to be worked into the surgical schedule tomorrow. Patient will be n.p.o. after midnight.  Sisters at bedside and was given the opportunity to ask questions.  In the ED Temperature 97.9, pulse 79, blood pressure 155/64, satting at 98% on room air Leukocytosis of 16 Hypokalemia at 3.4 Hyperglycemia at 184 Covid test pending-patient is fully vaccinated for Covid Chest x-ray shows no acute intrathoracic process Patient is on a baby aspirin   Hospital Course:  1-Displaced Closed left hip fracture ([place]) -After mechanical fall; no prodrome extensions or concerns for acute infection. -Continue to follow hip fracture protocol -Continue as needed analgesics and muscle relaxant -Orthopedic surgery performed surgical repair on 03/27/2020; so far well-tolerated. -Plan is for weightbearing as tolerated, staples to be removed on postoperative day 14; patient will require x-rays in the office in 2 weeks. -DVT prophylaxis for 40 days using full dose aspirin. -Stable vitamin D level and normal TSH. -Normal B12 level (585). -Physical therapy/Occupational Therapy pending to see patient; anticipate need for skilled nursing facility for rehab at discharge.  2-acute urinary retention -Foley catheter was placed -Planning to discontinue Foley catheter and assist ability to urinate on her own.  3-hypokalemia -Repleted and within normal limits at discharge -Repeat basic metabolic panel in 1 week to assure stability of electrolytes.  4-type 2 diabetes mellitus -Diet control as an outpatient -Most recent A1c 5.7 -Some elevation in her CBGs appreciated at time of  admission most likely as a acute phase reactant from demargination stress in the setting of acute hip fracture. -Continue to follow modified carbohydrate  diet.  5-hyperlipidemia  -continue statins  6-leukocytosis -No signs of acute infection -Appears to be secondary to stress demargination -WBC is back to normal after fluid resuscitation.  7-acute postoperative blood loss -Hemoglobin down to 6.9, after surgery and fluid resuscitation.   -1 unit of PRBCs has been transfused -Patient discharged on Niferex twice a day -Repeat CBC in 1 week to assure hemoglobin stability.  Procedures: -Left hip surgical intervention performed on 03/27/2020.  Consultations:  Orthopedic service  Discharge Exam: Vitals:   03/30/20 1320 03/30/20 1355  BP: 121/64 (!) 129/52  Pulse: 86 92  Resp: 18 18  Temp: 98.1 F (36.7 C) 98.5 F (36.9 C)  SpO2: 99% 96%   General exam: Alert, awake, oriented x 3; no chest pain, no nausea, no vomiting.  Good control pain with current analgesics (movement makes pain worse).  Good oxygen saturation on room air.  No major distress reports feeling weak and tired.   Respiratory system: Clear to auscultation. Respiratory effort normal. Cardiovascular system:RRR. No murmurs, rubs, gallops. Gastrointestinal system: Abdomen is nondistended, soft and nontender. No organomegaly or masses felt. Normal bowel sounds heard. Central nervous system: Alert and oriented. No focal neurological deficits. Extremities: No cyanosis or clubbing.  Left hip wound clean, dry and intact. Skin: No rashes, no petechiae Psychiatry: Judgement and insight appear normal. Mood & affect appropriate.   Discharge Instructions   Discharge Instructions    Diet - low sodium heart healthy   Complete by: As directed    Discharge instructions   Complete by: As directed    Take medications as prescribed Rehabilitation/conditioning as per the skilled nursing facility protocol Follow-up with orthopedic service in 2 weeks for follow up and repeat x-ray. Staple removals in 10 days. 40 days of full dose aspirin for DVT prophylaxis; resumption of baby  aspirin on daily basis after culmination of DVT prophylaxis.   Discharge wound care:   Complete by: As directed    Keep area clean and dry. Change for soiling and maintain dressing until follow-up with orthopedic service.     Allergies as of 03/30/2020      Reactions   Strawberry Extract Swelling      Medication List    TAKE these medications   acetaminophen 325 MG tablet Commonly known as: Tylenol Take 2 tablets (650 mg total) by mouth every 6 (six) hours as needed for mild pain, fever or headache.   aspirin 325 MG EC tablet Take 1 tablet (325 mg total) by mouth daily with breakfast. Please resume daily baby aspirin after completing 40 days of full dose. Start taking on: March 31, 2020 What changed:   medication strength  how much to take  when to take this  additional instructions   Biotin 10000 MCG Tabs Take 1 tablet daily by mouth.   Calcium-Vitamin D-Minerals 600-800 MG-UNIT Chew Chew 1 tablet daily by mouth.   docusate sodium 100 MG capsule Commonly known as: COLACE Take 1 capsule (100 mg total) by mouth 2 (two) times daily.   GLUCOSAMINE-CHONDROITIN PO Take 1 tablet by mouth daily. 1500 mg glucosamine and 1200 mg of choindroitan   glucose blood test strip Commonly known as: ONE TOUCH ULTRA TEST Test blood sugar twice daily   iron polysaccharides 150 MG capsule Commonly known as: NIFEREX Take 1 capsule (150 mg total) by mouth 2 (two) times daily.  methocarbamol 500 MG tablet Commonly known as: ROBAXIN Take 1 tablet (500 mg total) by mouth every 8 (eight) hours as needed for muscle spasms.   Multivitamin Women 50+ Tabs Take 1 tablet daily by mouth.   ONE TOUCH ULTRA 2 w/Device Kit Test blood sugar twice daily   onetouch ultrasoft lancets Use as instructed   polyethylene glycol 17 g packet Commonly known as: MIRALAX / GLYCOLAX Take 17 g by mouth daily as needed for mild constipation.   rosuvastatin 5 MG tablet Commonly known as:  Crestor Take 1 tablet (5 mg total) by mouth daily.   SUPER OMEGA 3 EPA/DHA PO Take 660 mg by mouth daily.   traMADol 50 MG tablet Commonly known as: ULTRAM Take 1 tablet (50 mg total) by mouth every 8 (eight) hours as needed for severe pain.   Vitamin D 50 MCG (2000 UT) tablet Take 2,000 Units by mouth daily.   vitamin E 180 MG (400 UNITS) capsule Take 400 Units daily by mouth.            Discharge Care Instructions  (From admission, onward)         Start     Ordered   03/30/20 0000  Discharge wound care:       Comments: Keep area clean and dry. Change for soiling and maintain dressing until follow-up with orthopedic service.   03/30/20 1511         Allergies  Allergen Reactions  . Strawberry Extract Swelling    Contact information for follow-up providers    Susan Peng, NP. Schedule an appointment as soon as possible for a visit in 2 week(s).   Specialty: Family Medicine Why: after discharge from SNF Contact information: [place] [address] [phone]        Carole Civil, MD. Schedule an appointment as soon as possible for a visit in 2 week(s).   Specialties: Orthopedic Surgery, Radiology Contact information: [address] [place] [address] [phone]            Contact information for after-discharge care    [facility] .   Service: Skilled Nursing Contact information: [address] [place] [phone]                  The results of significant diagnostics from this hospitalization (including imaging, microbiology, ancillary and laboratory) are listed below for reference.    Significant Diagnostic Studies: DG Chest Portable 1 View  Result Date: 03/25/2020 CLINICAL DATA:  [place], left hip fracture, preoperative evaluation, hypertension and diabetes, tobacco abuse EXAM: PORTABLE CHEST 1 VIEW COMPARISON:  None.  FINDINGS: Single frontal view of the chest demonstrates an unremarkable cardiac silhouette. No airspace disease, effusion, or pneumothorax. There are no acute displaced fractures. IMPRESSION: 1. No acute intrathoracic process. Electronically Signed   By: Randa Ngo M.D.   On: 03/25/2020 20:50   DG HIP OPERATIVE UNILAT WITH PELVIS LEFT  Result Date: 03/27/2020 CLINICAL DATA:  Left hip fracture EXAM: OPERATIVE  HIP (WITH PELVIS IF PERFORMED)  VIEWS TECHNIQUE: Fluoroscopic spot image(s) were submitted for interpretation post-operatively. COMPARISON:  None. FINDINGS: Intraop views of a left hip ORIF with intramedullary rod and screw fixation is seen. There are 9 intraop images. Fluoro time 1 minutes 20 seconds. IMPRESSION: Intraop views of ORIF of femur fracture with IM nail fixation. Electronically Signed   By: Prudencio Pair M.D.   On: 03/27/2020 15:15  DG Hip Unilat W or Wo Pelvis 2-3 Views Left  Result Date: 03/25/2020 CLINICAL DATA:  74 year old female status post fall with left hip pain. EXAM: DG HIP (WITH OR WITHOUT PELVIS) 2-3V LEFT COMPARISON:  None. FINDINGS: Highly comminuted and moderately impacted left femur intertrochanteric fracture. Displaced butterfly fragments. Left femoral head remains normally located. The pelvis appears to remain intact. Grossly intact proximal right femur. Negative lower abdominal and pelvic visceral contours. IMPRESSION: Highly comminuted left femur intertrochanteric fracture with varus impaction and displaced butterfly fragments. Electronically Signed   By: Genevie Ann M.D.   On: 03/25/2020 20:14    Microbiology: Recent Results (from the past 240 hour(s))  SARS Coronavirus 2 by RT PCR (hospital order, performed in [facility] hospital lab) Nasopharyngeal Nasopharyngeal Swab     Status: None   Collection Time: 03/25/20  8:11 PM   Specimen: Nasopharyngeal Swab  Result Value Ref Range Status   SARS Coronavirus 2 NEGATIVE NEGATIVE Final    Comment: (NOTE) SARS-CoV-2  target nucleic acids are NOT DETECTED.  The SARS-CoV-2 RNA is generally detectable in upper and lower respiratory specimens during the acute phase of infection. The lowest concentration of SARS-CoV-2 viral copies this assay can detect is 250 copies / mL. A negative result does not preclude SARS-CoV-2 infection and should not be used as the sole basis for treatment or other patient management decisions.  A negative result may occur with improper specimen collection / handling, submission of specimen other than nasopharyngeal swab, presence of viral mutation(s) within the areas targeted by this assay, and inadequate number of viral copies (<250 copies / mL). A negative result must be combined with clinical observations, patient history, and epidemiological information.  Fact Sheet for Patients:   StrictlyIdeas.no  Fact Sheet for Healthcare Providers: BankingDealers.co.za  This test is not yet approved or  cleared by the [place] FDA and has been authorized for detection and/or diagnosis of SARS-CoV-2 by FDA under an Emergency Use Authorization (EUA).  This EUA will remain in effect (meaning this test can be used) for the duration of the COVID-19 declaration under Section 564(b)(1) of the Act, 21 U.S.C. section 360bbb-3(b)(1), unless the authorization is terminated or revoked sooner.  Performed at [facility], [address], [place], [place] [address]   Surgical PCR screen     Status: None   Collection Time: 03/26/20  8:38 PM   Specimen: Nasal Mucosa; Nasal Swab  Result Value Ref Range Status   MRSA, PCR NEGATIVE NEGATIVE Final   Staphylococcus aureus NEGATIVE NEGATIVE Final    Comment: (NOTE) The Xpert SA Assay (FDA approved for NASAL specimens in patients 32 years of age and older), is one component of a comprehensive surveillance program. It is not intended to diagnose infection nor to guide or monitor treatment. Performed  at [facility], [address] [place], [place] [address]      Labs: Basic Metabolic Panel: Recent Labs  Lab 03/25/20 2010 03/26/20 0541 03/28/20 0716 03/29/20 0722  NA 132* 130* 136 133*  K 3.4* 4.2 3.7 3.6  CL 98 98 104 101  CO2 _0 GLUCOSE 184* 192* 140* 132*  BUN _1 6*  CREATININE 0.59 0.57 0.53 0.43*  CALCIUM 8.8* 8.7* 8.0* 8.3*  MG  --  1.7  --   --    Liver Function Tests: Recent Labs  Lab 03/26/20 0541  AST 20  ALT 14  ALKPHOS 38  BILITOT 0.7  PROT 5.9*  ALBUMIN 3.2*  CBC: Recent Labs  Lab 03/25/20 2010 03/26/20 0541 03/28/20 0716 03/29/20 0722 03/30/20 0654  WBC 16.0* 13.3* 9.2 8.8 7.5  NEUTROABS 13.3*  --   --   --   --   HGB 12.3 10.4* 7.6* 7.4* 6.9*  HCT 35.9* 30.8* 22.6* 21.4* 20.6*  MCV 93.0 93.9 95.4 95.5 95.8  PLT 180 172 132* 156 174   CBG: Recent Labs  Lab 03/29/20 1106 03/29/20 1627 03/29/20 2147 03/30/20 0754 03/30/20 1102  GLUCAP 186* 137* 169* 135* 143*    Signed:  Barton Dubois MD.  [facility] 03/30/2020, 3:14 PM

## 2020-03-30 NOTE — Care Management Important Message (Signed)
Important Message  Patient Details  Name: Susan Schmidt MRN: [id number] Date of Birth: 10-Nov-1945   Medicare Important Message Given:  Yes     Corey Harold 03/30/2020, 3:46 PM

## 2020-03-30 NOTE — Progress Notes (Signed)
CRITICAL VALUE ALERT  Critical Value:  Hgb 6.9  Date & Time Notied:  03/30/2020 9242  Provider Notified: [place]  Orders Received/Actions taken: awaiting further orders

## 2020-03-30 NOTE — Evaluation (Addendum)
Physical Therapy Evaluation Patient Details Name: Susan Schmidt MRN: [id number] DOB: [birth year] Today's Date: 03/30/2020   History of Present Illness  75 y.o. female, with history of osteoporosis, diabetes mellitus, presents to the ED with a chief complaint of hip pain status post fall and s/p L IM nail 9/17.  Clinical Impression  Pt admitted with above diagnosis. PTA, pt independent with mobility and living independently. Pt currently requiring max A with bed mobility due to LLE weakness and pain with movement. Pt requires multiple attempts to rise from sitting, able to clear bottom and stand supported statically for ~10 sec with max A before return to sitting due to pain. Pt assisted back to supine with RN in room. Pt currently with functional limitations due to the deficits listed below (see PT Problem List). Pt will benefit from skilled PT to increase their independence and safety with mobility to allow discharge to the venue listed below.       Follow Up Recommendations SNF    Equipment Recommendations  Other (comment) (TBD at next venue)    Recommendations for Other Services       Precautions / Restrictions Precautions Precautions: Fall Restrictions Weight Bearing Restrictions: No LLE Weight Bearing: Weight bearing as tolerated      Mobility  Bed Mobility Overal bed mobility: Needs Assistance Bed Mobility: Supine to Sit;Sit to Supine  Supine to sit: Max assist;HOB elevated Sit to supine: Max assist   General bed mobility comments: max A to bring LLE to EOB and upright trunk, mod A to scoot to sitting EOB  Transfers Overall transfer level: Needs assistance Equipment used: Rolling walker (2 wheeled) Transfers: Sit to/from Stand;Lateral/Scoot Transfers Sit to Stand: Max assist;From elevated surface   Lateral/Scoot Transfers: Medical sales representative transfer comment: multiple attempts, cues for pushing from seated surface, hip extension last, increased time and pain  with transfer and static standing, return to sitting after ~10 sec due to pain; min A with lateral scooting up to [facility]  Ambulation/Gait  General Gait Details: unable due to pain in static standing  Stairs            Wheelchair Mobility    Modified Rankin (Stroke Patients Only)       Balance Overall balance assessment: Needs assistance Sitting-balance support: Feet supported;Bilateral upper extremity supported Sitting balance-Leahy Scale: Poor Sitting balance - Comments: seated EOB, weight shift R away from L painful side   Standing balance support: During functional activity;Bilateral upper extremity supported Standing balance-Leahy Scale: Zero Standing balance comment: max A for static supported standing ~10 sec            Pertinent Vitals/Pain Pain Assessment: 0-10 Pain Score: 4  Pain Location: L hip Pain Descriptors / Indicators: Aching;Sore Pain Intervention(s): Limited activity within patient's tolerance;Monitored during session    Home Living Family/patient expects to be discharged to:: Private residence Living Arrangements: Alone Available Help at Discharge: Family;Friend(s);Available PRN/intermittently Type of Home: House Home Access: Level entry     Home Layout: One level (basement, "nothing important down there") Home Equipment: Shower seat      Prior Function Level of Independence: Independent         Comments: Pt reports independent with ADLs, ambulating community distances without AD, drives.     Hand Dominance        Extremity/Trunk Assessment   Upper Extremity Assessment Upper Extremity Assessment: Overall WFL for tasks assessed    Lower Extremity Assessment Lower Extremity Assessment: Generalized weakness;LLE deficits/detail LLE Deficits / Details:  ankle 4/5, knee 3/5, hip 2/5 LLE Sensation: WNL    Cervical / Trunk Assessment Cervical / Trunk Assessment: Normal  Communication   Communication: No difficulties  Cognition  Arousal/Alertness: Awake/alert Behavior During Therapy: WFL for tasks assessed/performed Overall Cognitive Status: Within Functional Limits for tasks assessed         General Comments      Exercises     Assessment/Plan    PT Assessment Patient needs continued PT services  PT Problem List Decreased strength;Decreased range of motion;Decreased activity tolerance;Decreased balance;Decreased mobility;Decreased knowledge of use of DME;Decreased knowledge of precautions;Pain       PT Treatment Interventions DME instruction;Gait training;Functional mobility training;Therapeutic activities;Therapeutic exercise;Balance training;Neuromuscular re-education;Patient/family education;Modalities    PT Goals (Current goals can be found in the Care Plan section)  Acute Rehab PT Goals Patient Stated Goal: SNF then home PT Goal Formulation: With patient Time For Goal Achievement: 04/13/20 Potential to Achieve Goals: Good    Frequency Min 5X/week   Barriers to discharge        Co-evaluation               AM-PAC PT "6 Clicks" Mobility  Outcome Measure Help needed turning from your back to your side while in a flat bed without using bedrails?: A Lot Help needed moving from lying on your back to sitting on the side of a flat bed without using bedrails?: A Lot Help needed moving to and from a bed to a chair (including a wheelchair)?: Total Help needed standing up from a chair using your arms (e.g., wheelchair or bedside chair)?: Total Help needed to walk in hospital room?: Total Help needed climbing 3-5 steps with a railing? : Total 6 Click Score: 8    End of Session Equipment Utilized During Treatment: Gait belt Activity Tolerance: Patient limited by pain Patient left: in bed;with call bell/phone within reach;with nursing/sitter in room Nurse Communication: Mobility status PT Visit Diagnosis: Unsteadiness on feet (R26.81);Other abnormalities of gait and mobility (R26.89);Muscle  weakness (generalized) (M62.81);History of falling (Z91.81);Pain Pain - Right/Left: Left Pain - part of body: Hip    Time: 1324-1340 PT Time Calculation (min) (ACUTE ONLY): 16 min   Charges:   PT Evaluation $PT Eval Low Complexity: 1 Low           Tori Hurley Blevins PT, DPT 03/30/20, 2:11 PM [phone]

## 2020-03-30 NOTE — Plan of Care (Signed)
  Problem: Acute Rehab PT Goals(only PT should resolve) Goal: Pt Will Go Supine/Side To Sit Outcome: Progressing Flowsheets (Taken 03/30/2020 1414) Pt will go Supine/Side to Sit: with moderate assist Goal: Pt Will Go Sit To Supine/Side Outcome: Progressing Flowsheets (Taken 03/30/2020 1414) Pt will go Sit to Supine/Side: with moderate assist Goal: Patient Will Transfer Sit To/From Stand Outcome: Progressing Flowsheets (Taken 03/30/2020 1414) Patient will transfer sit to/from stand: with moderate assist Goal: Pt Will Transfer Bed To Chair/Chair To Bed Outcome: Progressing Flowsheets (Taken 03/30/2020 1414) Pt will Transfer Bed to Chair/Chair to Bed: with mod assist   Tori Sheridan Gettel PT, DPT 03/30/20, 2:14 PM [phone]

## 2020-04-02 DIAGNOSIS — D649 Anemia, unspecified: Secondary | ICD-10-CM | POA: Diagnosis not present

## 2020-04-02 DIAGNOSIS — E785 Hyperlipidemia, unspecified: Secondary | ICD-10-CM | POA: Diagnosis not present

## 2020-04-02 DIAGNOSIS — E1169 Type 2 diabetes mellitus with other specified complication: Secondary | ICD-10-CM | POA: Diagnosis not present

## 2020-04-02 DIAGNOSIS — S72002D Fracture of unspecified part of neck of left femur, subsequent encounter for closed fracture with routine healing: Secondary | ICD-10-CM | POA: Diagnosis not present

## 2020-04-03 LAB — TYPE AND SCREEN
ABO/RH(D): A POS
Antibody Screen: NEGATIVE
Unit division: 0
Unit division: 0

## 2020-04-03 LAB — BPAM RBC
Blood Product Expiration Date: 202110052359
Blood Product Expiration Date: 202110082359
ISSUE DATE / TIME: 202109201333
Unit Type and Rh: 6200
Unit Type and Rh: 6200

## 2020-04-06 ENCOUNTER — Telehealth: Payer: Self-pay | Admitting: Orthopedic Surgery

## 2020-04-06 ENCOUNTER — Other Ambulatory Visit: Payer: Self-pay

## 2020-04-06 ENCOUNTER — Ambulatory Visit (INDEPENDENT_AMBULATORY_CARE_PROVIDER_SITE_OTHER): Payer: Medicare Other | Admitting: Orthopedic Surgery

## 2020-04-06 VITALS — Ht 63.0 in

## 2020-04-06 DIAGNOSIS — E876 Hypokalemia: Secondary | ICD-10-CM | POA: Diagnosis not present

## 2020-04-06 DIAGNOSIS — S72002D Fracture of unspecified part of neck of left femur, subsequent encounter for closed fracture with routine healing: Secondary | ICD-10-CM

## 2020-04-06 NOTE — Progress Notes (Signed)
Chief Complaint  Patient presents with  . Follow-up    Recheck on left hip, DOS 03-27-20.   Asked to see patients left hip incision status post intramedullary nailing  I do not see any problems with the incision.  I put a picture of the incision in the media section of the chart she will keep her regular appointment for x-ray October 4

## 2020-04-06 NOTE — Telephone Encounter (Signed)
Transferred left message for IDS? ? To call back about patient Facility is to take out staples at 2 week mark from surgery We will see her back as scheduled Incision looked good today, Dr Romeo Apple did not see any problems with her incision.

## 2020-04-06 NOTE — Telephone Encounter (Signed)
Call received from Zanette, wound nurse, [facility] facility, relaying that patient has a small area "about the size of a dime" of drainage "greenish". Advised to come in today. Facility transportation checking and will call us back about setting up patient's ride to come in today.

## 2020-04-06 NOTE — Telephone Encounter (Signed)
Call (voice message) received from [facility] facility, per Zanette, wound nurse, ph# [phone], asking question in reference to staple removal as discussed at today's office visit. Please advise.

## 2020-04-09 ENCOUNTER — Telehealth: Payer: Self-pay | Admitting: Orthopedic Surgery

## 2020-04-09 NOTE — Telephone Encounter (Signed)
Noted thanks °

## 2020-04-09 NOTE — Telephone Encounter (Signed)
Received a call  from [facility] that Ms. Susan Schmidt will be discharged from [facility] on Monday.  They will be starting her home health therapy and just wanted [place] to know this.  They will be sending any necessary notes and updates for Dr. Romeo Apple to sign off on.  Thanks

## 2020-04-09 NOTE — Telephone Encounter (Signed)
Zanette, wound care nurse from [facility] called back this morning.  She wanted to verify staples were to be removed 14 days post op and also if Ms. Romanoski needed a dry dressing after the removal.  I spoke to pur LPN, Jessie Foot who stated this would be fine.  Zanette was advised.

## 2020-04-09 NOTE — Telephone Encounter (Signed)
Thanks Leta Thanks Angie

## 2020-04-10 DIAGNOSIS — S72002D Fracture of unspecified part of neck of left femur, subsequent encounter for closed fracture with routine healing: Secondary | ICD-10-CM | POA: Diagnosis not present

## 2020-04-10 DIAGNOSIS — M6281 Muscle weakness (generalized): Secondary | ICD-10-CM | POA: Diagnosis not present

## 2020-04-13 ENCOUNTER — Ambulatory Visit: Payer: Medicare Other

## 2020-04-13 ENCOUNTER — Ambulatory Visit (INDEPENDENT_AMBULATORY_CARE_PROVIDER_SITE_OTHER): Payer: Medicare Other | Admitting: Orthopedic Surgery

## 2020-04-13 ENCOUNTER — Encounter: Payer: Self-pay | Admitting: Orthopedic Surgery

## 2020-04-13 ENCOUNTER — Other Ambulatory Visit: Payer: Self-pay

## 2020-04-13 DIAGNOSIS — E1169 Type 2 diabetes mellitus with other specified complication: Secondary | ICD-10-CM | POA: Diagnosis not present

## 2020-04-13 DIAGNOSIS — E785 Hyperlipidemia, unspecified: Secondary | ICD-10-CM | POA: Diagnosis not present

## 2020-04-13 DIAGNOSIS — S72002D Fracture of unspecified part of neck of left femur, subsequent encounter for closed fracture with routine healing: Secondary | ICD-10-CM | POA: Diagnosis not present

## 2020-04-13 NOTE — Progress Notes (Signed)
Chief Complaint  Patient presents with  . Routine Post Op    03/27/20 post op left hip     Postop hip fracture 17 days  X-rays show gamma nail healing fracture left hip doing well.  Patient going home from nursing home today using a walker excellent range of motion of the left hip passive weakness hip flexion active  Encounter Diagnosis  Name Primary?  . Closed fracture of left hip with routine healing, subsequent encounter Yes    Follow-up x-rays 5 weeks

## 2020-04-16 DIAGNOSIS — W19XXXD Unspecified fall, subsequent encounter: Secondary | ICD-10-CM | POA: Diagnosis not present

## 2020-04-16 DIAGNOSIS — E119 Type 2 diabetes mellitus without complications: Secondary | ICD-10-CM | POA: Diagnosis not present

## 2020-04-16 DIAGNOSIS — E785 Hyperlipidemia, unspecified: Secondary | ICD-10-CM | POA: Diagnosis not present

## 2020-04-16 DIAGNOSIS — Z9181 History of falling: Secondary | ICD-10-CM | POA: Diagnosis not present

## 2020-04-16 DIAGNOSIS — D649 Anemia, unspecified: Secondary | ICD-10-CM | POA: Diagnosis not present

## 2020-04-16 DIAGNOSIS — Z7982 Long term (current) use of aspirin: Secondary | ICD-10-CM | POA: Diagnosis not present

## 2020-04-16 DIAGNOSIS — M80052D Age-related osteoporosis with current pathological fracture, left femur, subsequent encounter for fracture with routine healing: Secondary | ICD-10-CM | POA: Diagnosis not present

## 2020-04-21 DIAGNOSIS — M6281 Muscle weakness (generalized): Secondary | ICD-10-CM | POA: Diagnosis not present

## 2020-04-21 DIAGNOSIS — M80052D Age-related osteoporosis with current pathological fracture, left femur, subsequent encounter for fracture with routine healing: Secondary | ICD-10-CM | POA: Diagnosis not present

## 2020-04-21 DIAGNOSIS — E785 Hyperlipidemia, unspecified: Secondary | ICD-10-CM | POA: Diagnosis not present

## 2020-04-21 DIAGNOSIS — E119 Type 2 diabetes mellitus without complications: Secondary | ICD-10-CM | POA: Diagnosis not present

## 2020-04-21 DIAGNOSIS — Z9181 History of falling: Secondary | ICD-10-CM | POA: Diagnosis not present

## 2020-04-21 DIAGNOSIS — M810 Age-related osteoporosis without current pathological fracture: Secondary | ICD-10-CM | POA: Diagnosis not present

## 2020-04-21 DIAGNOSIS — Z7982 Long term (current) use of aspirin: Secondary | ICD-10-CM | POA: Diagnosis not present

## 2020-04-21 DIAGNOSIS — D649 Anemia, unspecified: Secondary | ICD-10-CM | POA: Diagnosis not present

## 2020-04-21 DIAGNOSIS — W19XXXD Unspecified fall, subsequent encounter: Secondary | ICD-10-CM | POA: Diagnosis not present

## 2020-04-23 ENCOUNTER — Ambulatory Visit: Payer: Medicare Other | Admitting: Orthopedic Surgery

## 2020-04-24 DIAGNOSIS — E785 Hyperlipidemia, unspecified: Secondary | ICD-10-CM | POA: Diagnosis not present

## 2020-04-24 DIAGNOSIS — Z9181 History of falling: Secondary | ICD-10-CM | POA: Diagnosis not present

## 2020-04-24 DIAGNOSIS — E119 Type 2 diabetes mellitus without complications: Secondary | ICD-10-CM | POA: Diagnosis not present

## 2020-04-24 DIAGNOSIS — D649 Anemia, unspecified: Secondary | ICD-10-CM | POA: Diagnosis not present

## 2020-04-24 DIAGNOSIS — M80052D Age-related osteoporosis with current pathological fracture, left femur, subsequent encounter for fracture with routine healing: Secondary | ICD-10-CM | POA: Diagnosis not present

## 2020-04-24 DIAGNOSIS — Z7982 Long term (current) use of aspirin: Secondary | ICD-10-CM | POA: Diagnosis not present

## 2020-04-24 DIAGNOSIS — W19XXXD Unspecified fall, subsequent encounter: Secondary | ICD-10-CM | POA: Diagnosis not present

## 2020-04-27 DIAGNOSIS — W19XXXD Unspecified fall, subsequent encounter: Secondary | ICD-10-CM | POA: Diagnosis not present

## 2020-04-27 DIAGNOSIS — D649 Anemia, unspecified: Secondary | ICD-10-CM | POA: Diagnosis not present

## 2020-04-27 DIAGNOSIS — Z9181 History of falling: Secondary | ICD-10-CM | POA: Diagnosis not present

## 2020-04-27 DIAGNOSIS — E785 Hyperlipidemia, unspecified: Secondary | ICD-10-CM | POA: Diagnosis not present

## 2020-04-27 DIAGNOSIS — M80052D Age-related osteoporosis with current pathological fracture, left femur, subsequent encounter for fracture with routine healing: Secondary | ICD-10-CM | POA: Diagnosis not present

## 2020-04-27 DIAGNOSIS — E119 Type 2 diabetes mellitus without complications: Secondary | ICD-10-CM | POA: Diagnosis not present

## 2020-04-27 DIAGNOSIS — Z7982 Long term (current) use of aspirin: Secondary | ICD-10-CM | POA: Diagnosis not present

## 2020-04-29 DIAGNOSIS — M80052D Age-related osteoporosis with current pathological fracture, left femur, subsequent encounter for fracture with routine healing: Secondary | ICD-10-CM | POA: Diagnosis not present

## 2020-04-29 DIAGNOSIS — Z9181 History of falling: Secondary | ICD-10-CM | POA: Diagnosis not present

## 2020-04-29 DIAGNOSIS — Z7982 Long term (current) use of aspirin: Secondary | ICD-10-CM | POA: Diagnosis not present

## 2020-04-29 DIAGNOSIS — W19XXXD Unspecified fall, subsequent encounter: Secondary | ICD-10-CM | POA: Diagnosis not present

## 2020-04-29 DIAGNOSIS — D649 Anemia, unspecified: Secondary | ICD-10-CM | POA: Diagnosis not present

## 2020-04-29 DIAGNOSIS — E119 Type 2 diabetes mellitus without complications: Secondary | ICD-10-CM | POA: Diagnosis not present

## 2020-04-29 DIAGNOSIS — E785 Hyperlipidemia, unspecified: Secondary | ICD-10-CM | POA: Diagnosis not present

## 2020-05-05 DIAGNOSIS — Z7982 Long term (current) use of aspirin: Secondary | ICD-10-CM | POA: Diagnosis not present

## 2020-05-05 DIAGNOSIS — M80052D Age-related osteoporosis with current pathological fracture, left femur, subsequent encounter for fracture with routine healing: Secondary | ICD-10-CM | POA: Diagnosis not present

## 2020-05-05 DIAGNOSIS — W19XXXD Unspecified fall, subsequent encounter: Secondary | ICD-10-CM | POA: Diagnosis not present

## 2020-05-05 DIAGNOSIS — Z9181 History of falling: Secondary | ICD-10-CM | POA: Diagnosis not present

## 2020-05-05 DIAGNOSIS — D649 Anemia, unspecified: Secondary | ICD-10-CM | POA: Diagnosis not present

## 2020-05-05 DIAGNOSIS — E119 Type 2 diabetes mellitus without complications: Secondary | ICD-10-CM | POA: Diagnosis not present

## 2020-05-05 DIAGNOSIS — E785 Hyperlipidemia, unspecified: Secondary | ICD-10-CM | POA: Diagnosis not present

## 2020-05-07 DIAGNOSIS — M80052D Age-related osteoporosis with current pathological fracture, left femur, subsequent encounter for fracture with routine healing: Secondary | ICD-10-CM | POA: Diagnosis not present

## 2020-05-07 DIAGNOSIS — D649 Anemia, unspecified: Secondary | ICD-10-CM | POA: Diagnosis not present

## 2020-05-07 DIAGNOSIS — Z7982 Long term (current) use of aspirin: Secondary | ICD-10-CM | POA: Diagnosis not present

## 2020-05-07 DIAGNOSIS — E785 Hyperlipidemia, unspecified: Secondary | ICD-10-CM | POA: Diagnosis not present

## 2020-05-07 DIAGNOSIS — Z9181 History of falling: Secondary | ICD-10-CM | POA: Diagnosis not present

## 2020-05-07 DIAGNOSIS — W19XXXD Unspecified fall, subsequent encounter: Secondary | ICD-10-CM | POA: Diagnosis not present

## 2020-05-07 DIAGNOSIS — E119 Type 2 diabetes mellitus without complications: Secondary | ICD-10-CM | POA: Diagnosis not present

## 2020-05-11 DIAGNOSIS — Z7982 Long term (current) use of aspirin: Secondary | ICD-10-CM | POA: Diagnosis not present

## 2020-05-11 DIAGNOSIS — D649 Anemia, unspecified: Secondary | ICD-10-CM | POA: Diagnosis not present

## 2020-05-11 DIAGNOSIS — M6281 Muscle weakness (generalized): Secondary | ICD-10-CM | POA: Diagnosis not present

## 2020-05-11 DIAGNOSIS — E785 Hyperlipidemia, unspecified: Secondary | ICD-10-CM | POA: Diagnosis not present

## 2020-05-11 DIAGNOSIS — Z9181 History of falling: Secondary | ICD-10-CM | POA: Diagnosis not present

## 2020-05-11 DIAGNOSIS — E119 Type 2 diabetes mellitus without complications: Secondary | ICD-10-CM | POA: Diagnosis not present

## 2020-05-11 DIAGNOSIS — M810 Age-related osteoporosis without current pathological fracture: Secondary | ICD-10-CM | POA: Diagnosis not present

## 2020-05-11 DIAGNOSIS — M80052D Age-related osteoporosis with current pathological fracture, left femur, subsequent encounter for fracture with routine healing: Secondary | ICD-10-CM | POA: Diagnosis not present

## 2020-05-11 DIAGNOSIS — W19XXXD Unspecified fall, subsequent encounter: Secondary | ICD-10-CM | POA: Diagnosis not present

## 2020-05-14 DIAGNOSIS — E119 Type 2 diabetes mellitus without complications: Secondary | ICD-10-CM | POA: Diagnosis not present

## 2020-05-14 DIAGNOSIS — W19XXXD Unspecified fall, subsequent encounter: Secondary | ICD-10-CM | POA: Diagnosis not present

## 2020-05-14 DIAGNOSIS — Z7982 Long term (current) use of aspirin: Secondary | ICD-10-CM | POA: Diagnosis not present

## 2020-05-14 DIAGNOSIS — Z9181 History of falling: Secondary | ICD-10-CM | POA: Diagnosis not present

## 2020-05-14 DIAGNOSIS — E785 Hyperlipidemia, unspecified: Secondary | ICD-10-CM | POA: Diagnosis not present

## 2020-05-14 DIAGNOSIS — D649 Anemia, unspecified: Secondary | ICD-10-CM | POA: Diagnosis not present

## 2020-05-14 DIAGNOSIS — M80052D Age-related osteoporosis with current pathological fracture, left femur, subsequent encounter for fracture with routine healing: Secondary | ICD-10-CM | POA: Diagnosis not present

## 2020-05-18 ENCOUNTER — Encounter: Payer: Self-pay | Admitting: Orthopedic Surgery

## 2020-05-18 ENCOUNTER — Ambulatory Visit (INDEPENDENT_AMBULATORY_CARE_PROVIDER_SITE_OTHER): Payer: Medicare Other | Admitting: Orthopedic Surgery

## 2020-05-18 ENCOUNTER — Other Ambulatory Visit: Payer: Self-pay

## 2020-05-18 ENCOUNTER — Ambulatory Visit: Payer: Medicare Other

## 2020-05-18 VITALS — BP 148/76 | HR 86 | Ht 63.0 in | Wt 110.0 lb

## 2020-05-18 DIAGNOSIS — S72002D Fracture of unspecified part of neck of left femur, subsequent encounter for closed fracture with routine healing: Secondary | ICD-10-CM | POA: Diagnosis not present

## 2020-05-18 NOTE — Progress Notes (Signed)
Chief Complaint  Patient presents with  . Hip Pain    follow up hip, 03/27/20.     2 months Plus status post gamma nail left hip  Patient is walking better now transferring to a cane her hip strength is improving she has some mild swelling at the ankle no calf pain tenderness swelling or positivity of the Denna Haggard' sign her x-ray looks good follow-up in a month for x-ray  Encounter Diagnosis  Name Primary?  . Closed fracture of left hip with routine healing, subsequent encounter Yes

## 2020-05-21 DIAGNOSIS — Z7982 Long term (current) use of aspirin: Secondary | ICD-10-CM | POA: Diagnosis not present

## 2020-05-21 DIAGNOSIS — M80052D Age-related osteoporosis with current pathological fracture, left femur, subsequent encounter for fracture with routine healing: Secondary | ICD-10-CM | POA: Diagnosis not present

## 2020-05-21 DIAGNOSIS — E119 Type 2 diabetes mellitus without complications: Secondary | ICD-10-CM | POA: Diagnosis not present

## 2020-05-21 DIAGNOSIS — E785 Hyperlipidemia, unspecified: Secondary | ICD-10-CM | POA: Diagnosis not present

## 2020-05-21 DIAGNOSIS — Z9181 History of falling: Secondary | ICD-10-CM | POA: Diagnosis not present

## 2020-05-21 DIAGNOSIS — W19XXXD Unspecified fall, subsequent encounter: Secondary | ICD-10-CM | POA: Diagnosis not present

## 2020-05-21 DIAGNOSIS — D649 Anemia, unspecified: Secondary | ICD-10-CM | POA: Diagnosis not present

## 2020-05-29 DIAGNOSIS — Z9181 History of falling: Secondary | ICD-10-CM | POA: Diagnosis not present

## 2020-05-29 DIAGNOSIS — M80052D Age-related osteoporosis with current pathological fracture, left femur, subsequent encounter for fracture with routine healing: Secondary | ICD-10-CM | POA: Diagnosis not present

## 2020-05-29 DIAGNOSIS — D649 Anemia, unspecified: Secondary | ICD-10-CM | POA: Diagnosis not present

## 2020-05-29 DIAGNOSIS — W19XXXD Unspecified fall, subsequent encounter: Secondary | ICD-10-CM | POA: Diagnosis not present

## 2020-05-29 DIAGNOSIS — E785 Hyperlipidemia, unspecified: Secondary | ICD-10-CM | POA: Diagnosis not present

## 2020-05-29 DIAGNOSIS — E119 Type 2 diabetes mellitus without complications: Secondary | ICD-10-CM | POA: Diagnosis not present

## 2020-05-29 DIAGNOSIS — Z7982 Long term (current) use of aspirin: Secondary | ICD-10-CM | POA: Diagnosis not present

## 2020-06-02 DIAGNOSIS — E785 Hyperlipidemia, unspecified: Secondary | ICD-10-CM | POA: Diagnosis not present

## 2020-06-02 DIAGNOSIS — Z9181 History of falling: Secondary | ICD-10-CM | POA: Diagnosis not present

## 2020-06-02 DIAGNOSIS — M80052D Age-related osteoporosis with current pathological fracture, left femur, subsequent encounter for fracture with routine healing: Secondary | ICD-10-CM | POA: Diagnosis not present

## 2020-06-02 DIAGNOSIS — Z7982 Long term (current) use of aspirin: Secondary | ICD-10-CM | POA: Diagnosis not present

## 2020-06-02 DIAGNOSIS — W19XXXD Unspecified fall, subsequent encounter: Secondary | ICD-10-CM | POA: Diagnosis not present

## 2020-06-02 DIAGNOSIS — E119 Type 2 diabetes mellitus without complications: Secondary | ICD-10-CM | POA: Diagnosis not present

## 2020-06-02 DIAGNOSIS — D649 Anemia, unspecified: Secondary | ICD-10-CM | POA: Diagnosis not present

## 2020-06-08 DIAGNOSIS — D649 Anemia, unspecified: Secondary | ICD-10-CM | POA: Diagnosis not present

## 2020-06-08 DIAGNOSIS — Z7982 Long term (current) use of aspirin: Secondary | ICD-10-CM | POA: Diagnosis not present

## 2020-06-08 DIAGNOSIS — W19XXXD Unspecified fall, subsequent encounter: Secondary | ICD-10-CM | POA: Diagnosis not present

## 2020-06-08 DIAGNOSIS — E119 Type 2 diabetes mellitus without complications: Secondary | ICD-10-CM | POA: Diagnosis not present

## 2020-06-08 DIAGNOSIS — E785 Hyperlipidemia, unspecified: Secondary | ICD-10-CM | POA: Diagnosis not present

## 2020-06-08 DIAGNOSIS — Z9181 History of falling: Secondary | ICD-10-CM | POA: Diagnosis not present

## 2020-06-08 DIAGNOSIS — M80052D Age-related osteoporosis with current pathological fracture, left femur, subsequent encounter for fracture with routine healing: Secondary | ICD-10-CM | POA: Diagnosis not present

## 2020-06-15 ENCOUNTER — Other Ambulatory Visit: Payer: Self-pay

## 2020-06-15 ENCOUNTER — Ambulatory Visit: Payer: Medicare Other

## 2020-06-15 ENCOUNTER — Ambulatory Visit: Payer: Medicare Other | Admitting: Orthopedic Surgery

## 2020-06-15 ENCOUNTER — Encounter: Payer: Self-pay | Admitting: Orthopedic Surgery

## 2020-06-15 ENCOUNTER — Ambulatory Visit (INDEPENDENT_AMBULATORY_CARE_PROVIDER_SITE_OTHER): Payer: Medicare Other | Admitting: Orthopedic Surgery

## 2020-06-15 VITALS — BP 141/71 | HR 75 | Ht 62.0 in | Wt 104.2 lb

## 2020-06-15 DIAGNOSIS — S72002D Fracture of unspecified part of neck of left femur, subsequent encounter for closed fracture with routine healing: Secondary | ICD-10-CM

## 2020-06-15 NOTE — Patient Instructions (Signed)
Increase activity as tolerated   Tylenol as needed   Use cane

## 2020-06-15 NOTE — Progress Notes (Signed)
Chief Complaint  Patient presents with  . Hip Pain    L/ hurting a little    Surgery was March 27, 2020 she had a left gamma nail for left inner troches fracture  She is doing well she is graduated to a cane for most of her ambulation she did have a little thigh pain which was relieved with Tylenol  Her x-ray shows the fracture is healing nicely the hardware is intact without complication recommend gradual increase in activity and follow-up in 3 months for final x-ray

## 2020-06-26 ENCOUNTER — Other Ambulatory Visit: Payer: Self-pay | Admitting: Adult Health

## 2020-06-26 DIAGNOSIS — Z0000 Encounter for general adult medical examination without abnormal findings: Secondary | ICD-10-CM

## 2020-07-02 ENCOUNTER — Ambulatory Visit
Admission: RE | Admit: 2020-07-02 | Discharge: 2020-07-02 | Disposition: A | Discharge status: Home / Self Care | Payer: Medicare Other | Source: Ambulatory Visit | Attending: Adult Health | Admitting: Adult Health

## 2020-07-02 ENCOUNTER — Other Ambulatory Visit: Payer: Self-pay

## 2020-07-02 DIAGNOSIS — Z780 Asymptomatic menopausal state: Secondary | ICD-10-CM | POA: Diagnosis not present

## 2020-07-02 DIAGNOSIS — Z0000 Encounter for general adult medical examination without abnormal findings: Secondary | ICD-10-CM

## 2020-07-02 DIAGNOSIS — M8589 Other specified disorders of bone density and structure, multiple sites: Secondary | ICD-10-CM | POA: Diagnosis not present

## 2020-07-02 DIAGNOSIS — M810 Age-related osteoporosis without current pathological fracture: Secondary | ICD-10-CM

## 2020-07-02 DIAGNOSIS — E782 Mixed hyperlipidemia: Secondary | ICD-10-CM

## 2020-07-02 DIAGNOSIS — E118 Type 2 diabetes mellitus with unspecified complications: Secondary | ICD-10-CM

## 2020-07-08 ENCOUNTER — Other Ambulatory Visit: Payer: Self-pay | Admitting: *Deleted

## 2020-07-08 MED ORDER — ALENDRONATE SODIUM 70 MG PO TABS: 70.0000 mg | ORAL_TABLET | ORAL | 11 refills | Status: DC

## 2020-08-04 ENCOUNTER — Other Ambulatory Visit: Payer: Self-pay

## 2020-08-04 ENCOUNTER — Ambulatory Visit
Admission: RE | Admit: 2020-08-04 | Discharge: 2020-08-04 | Disposition: A | Discharge status: Home / Self Care | Payer: Medicare Other | Source: Ambulatory Visit | Attending: Adult Health | Admitting: Adult Health

## 2020-08-04 DIAGNOSIS — Z1231 Encounter for screening mammogram for malignant neoplasm of breast: Secondary | ICD-10-CM | POA: Diagnosis not present

## 2020-08-04 DIAGNOSIS — Z0000 Encounter for general adult medical examination without abnormal findings: Secondary | ICD-10-CM

## 2020-08-13 ENCOUNTER — Telehealth: Payer: Self-pay | Admitting: Adult Health

## 2020-08-13 NOTE — Progress Notes (Signed)
  Chronic Care Management   Note  08/13/2020 Name: Devine Dant MRN: [id number] DOB: 06-06-1946  Talibah Colasurdo Seoane is a 75 y.o. year old female who is a primary care patient of Shirline Frees, NP. I reached out to Lupita Leash by phone today in response to a referral sent by Ms. Placido Sou Beste's PCP, Shirline Frees, NP.   Ms. Stumpo was given information about Chronic Care Management services today including:  1. CCM service includes personalized support from designated clinical staff supervised by her physician, including individualized plan of care and coordination with other care providers 2. 24/7 contact phone numbers for assistance for urgent and routine care needs. 3. Service will only be billed when office clinical staff spend 20 minutes or more in a month to coordinate care. 4. Only one practitioner may furnish and bill the service in a calendar month. 5. The patient may stop CCM services at any time (effective at the end of the month) by phone call to the office staff.   Patient did not agree to enrollment in care management services and does not wish to consider at this time.  Follow up plan:   Carley Perdue [employer] Scheduler

## 2020-09-14 ENCOUNTER — Ambulatory Visit: Payer: Medicare Other

## 2020-09-14 ENCOUNTER — Other Ambulatory Visit: Payer: Self-pay

## 2020-09-14 ENCOUNTER — Ambulatory Visit (INDEPENDENT_AMBULATORY_CARE_PROVIDER_SITE_OTHER): Payer: Medicare Other | Admitting: Orthopedic Surgery

## 2020-09-14 ENCOUNTER — Encounter: Payer: Self-pay | Admitting: Orthopedic Surgery

## 2020-09-14 VITALS — BP 154/81 | HR 81 | Ht 62.0 in | Wt 104.0 lb

## 2020-09-14 DIAGNOSIS — S72002D Fracture of unspecified part of neck of left femur, subsequent encounter for closed fracture with routine healing: Secondary | ICD-10-CM | POA: Diagnosis not present

## 2020-09-14 NOTE — Progress Notes (Signed)
Chief Complaint  Patient presents with  . Post-op Follow-up    03/27/20 left hip fracture    6 mos post op ORIF LEFT HIP W/ LT GAMMA NAIL FOR IT Frx.  Susan Schmidt is doing well she walks sometimes independently sometimes with a cane  She can flex her hip greater than 120 degrees without pain  Ambulates with and without the cane as stated  Today her x-rays show complete consolidation of the fracture no hardware complications  Encounter Diagnosis  Name Primary?  . Closed fracture of left hip with routine healing, subsequent encounter 03/27/20 Yes    Released

## 2020-09-15 ENCOUNTER — Encounter: Payer: Self-pay | Admitting: Adult Health

## 2020-09-15 ENCOUNTER — Ambulatory Visit (INDEPENDENT_AMBULATORY_CARE_PROVIDER_SITE_OTHER): Payer: Medicare Other | Admitting: Adult Health

## 2020-09-15 VITALS — BP 142/68 | HR 68 | Temp 98.6°F | Ht 62.0 in | Wt 109.4 lb

## 2020-09-15 DIAGNOSIS — S72002D Fracture of unspecified part of neck of left femur, subsequent encounter for closed fracture with routine healing: Secondary | ICD-10-CM

## 2020-09-15 DIAGNOSIS — E118 Type 2 diabetes mellitus with unspecified complications: Secondary | ICD-10-CM

## 2020-09-15 LAB — POCT GLYCOSYLATED HEMOGLOBIN (HGB A1C): Hemoglobin A1C: 5.7 % — AB (ref 4.0–5.6)

## 2020-09-15 MED ORDER — ONETOUCH ULTRA 2 W/DEVICE KIT: PACK | 0 refills | Status: DC

## 2020-09-15 NOTE — Patient Instructions (Signed)
It was great seeing you today!   Your A1c was 5.7 - there was no change   You are do for your physical any time after 03/17/2021.

## 2020-09-15 NOTE — Progress Notes (Signed)
Subjective:    Patient ID: Susan Schmidt, female    DOB: Feb 27, 1946, 75 y.o.   MRN: [id number]  HPI  75 year old female who  has a past medical history of DM (diabetes mellitus), type 2, uncontrolled ([place]), Hyperlipidemia, and Osteoporosis.  Presents to the office today for 64-monthfollow-up regarding diabetes mellitus.  She is currently diet controlled.  She does monitor her blood sugars twice a day with readings consistently in the 120s to 130s.  She is eating a heart healthy diet and staying active with exercise and working in the yard Lab Results  Component Value Date   HGBA1C 5.7 (A) 09/15/2020   Unfortunately, since the last time I saw her she did sustain a closed left hip fracture back in September 2021.  She was in her garden, when she bent over she lost her balance and fell onto her left side.  Since that incident she is doing well, sometimes walks independently and sometimes with a cane.  She was seen yesterday by orthopedic surgery for 657-monthollow-up.  Her x-ray showed complete consolidation of the fracture with no hardware complications. Reports that she has been dismissed from orthopedics   Review of Systems See HPI   Past Medical History:  Diagnosis Date  . DM (diabetes mellitus), type 2, uncontrolled ([facility] [place]  . Hyperlipidemia   . Osteoporosis     Social History   Socioeconomic History  . Marital status: Single    Spouse name: Not on file  . Number of children: Not on file  . Years of education: Not on file  . Highest education level: Not on file  Occupational History  . Not on file  Tobacco Use  . Smoking status: Former Smoker    Packs/day: 2.00    Types: Cigarettes    Start date: 07/11/1964    Quit date: 07/11/1976    Years since quitting: 44.2  . Smokeless tobacco: Never Used  Vaping Use  . Vaping Use: Never used  Substance and Sexual Activity  . Alcohol use: No  . Drug use: Never  . Sexual activity: Not Currently  Other Topics Concern  . Not  on file  Social History Narrative   Retired    Not married    No kids          Social Determinants of Health   Financial Resource Strain: Not on file  Food Insecurity: Not on file  Transportation Needs: Not on file  Physical Activity: Not on file  Stress: Not on file  Social Connections: Not on file  Intimate Partner Violence: Not on file    Past Surgical History:  Procedure Laterality Date  . ANKLE SURGERY Right   . COLONOSCOPY N/A 07/03/2019   Procedure: COLONOSCOPY;  Surgeon: ReRogene HoustonMD;  [facility];  Service: Endoscopy;  Laterality: N/A;  830  . INTRAMEDULLARY (IM) NAIL INTERTROCHANTERIC Left 03/27/2020   Procedure: INTRAMEDULLARY (IM) NAIL INTERTROCHANTRIC;  Surgeon: HaCarole CivilMD;  Location: [facility];  Service: Orthopedics;  Laterality: Left;  . pilonidal cyst      Family History  Problem Relation Age of Onset  . Diabetes Mother   . Hypertension Mother   . Hypertension Father   . Heart attack Father   . Hypertension Sister   . Diabetes Brother   . Diabetes Sister     Allergies  Allergen Reactions  . Strawberry Extract Swelling    Current Outpatient Medications on File Prior to Visit  Medication Sig Dispense Refill  . acetaminophen (TYLENOL) 325 MG tablet Take 2 tablets (650 mg total) by mouth every 6 (six) hours as needed for mild pain, fever or headache.    . alendronate (FOSAMAX) 70 MG tablet Take 1 tablet (70 mg total) by mouth every 7 (seven) days. Take with a full glass of water on an empty stomach. 4 tablet 11  . aspirin EC 81 MG tablet Take 81 mg by mouth daily. Swallow whole.    . Biotin 10000 MCG TABS Take 1 tablet daily by mouth.    . Calcium Carbonate-Vit D-Min (CALCIUM-VITAMIN D-MINERALS) 600-800 MG-UNIT CHEW Chew 1 tablet daily by mouth.    . Cholecalciferol (VITAMIN D) 50 MCG (2000 UT) tablet Take 2,000 Units by mouth daily.    . Ferrous Sulfate (IRON) 325 (65 Fe) MG TABS Take by mouth.    Marland Kitchen  GLUCOSAMINE-CHONDROITIN PO Take 1 tablet by mouth daily. 1500 mg glucosamine and 1200 mg of choindroitan    . glucose blood (ONE TOUCH ULTRA TEST) test strip Test blood sugar twice daily 100 each 12  . Lancets (ONETOUCH ULTRASOFT) lancets Use as instructed 100 each 12  . Multiple Vitamins-Minerals (MULTIVITAMIN WOMEN 50+) TABS Take 1 tablet daily by mouth.    . Omega-3 Fatty Acids (SUPER OMEGA 3 EPA/DHA PO) Take 660 mg by mouth daily.    . rosuvastatin (CRESTOR) 5 MG tablet Take 1 tablet (5 mg total) by mouth daily. 90 tablet 1  . vitamin E 180 MG (400 UNITS) capsule Take 400 Units by mouth daily.     No current facility-administered medications on file prior to visit.    BP (!) 142/68 (BP Location: Left Arm, Patient Position: Sitting, Cuff Size: Normal)   Pulse 68   Temp 98.6 F (37 C) (Oral)   Ht 5' 2" (1.575 m)   Wt 109 lb 6 oz (49.6 kg)   SpO2 98%   BMI 20.00 kg/m       Objective:   Physical Exam Vitals and nursing note reviewed.  Constitutional:      Appearance: Normal appearance.  Cardiovascular:     Rate and Rhythm: Normal rate and regular rhythm.     Pulses: Normal pulses.     Heart sounds: Normal heart sounds.  Pulmonary:     Effort: Pulmonary effort is normal.     Breath sounds: Normal breath sounds.  Skin:    General: Skin is warm and dry.     Capillary Refill: Capillary refill takes less than 2 seconds.  Neurological:     General: No focal deficit present.     Mental Status: She is alert and oriented to person, place, and time.     Gait: Gait abnormal (steady gait with cane).  Psychiatric:        Mood and Affect: Mood normal.        Behavior: Behavior normal.        Thought Content: Thought content normal.        Judgment: Judgment normal.       Assessment & Plan:  1. Controlled type 2 diabetes mellitus with complication, without long-term current use of insulin (HCC)  - POCT HgB A1C- 5.7 - no change. Needs a new monitor  - Blood Glucose Monitoring  Suppl (ONE TOUCH ULTRA 2) w/Device KIT; Test blood sugar twice daily  Dispense: 1 kit; Refill: 0  2. Closed fracture of left hip with routine healing, subsequent encounter - Continue to stay active - No longer needed  follow up with orthopedics   Dorothyann Peng, NP

## 2020-10-08 ENCOUNTER — Other Ambulatory Visit: Payer: Self-pay | Admitting: Adult Health

## 2020-10-19 DIAGNOSIS — H2513 Age-related nuclear cataract, bilateral: Secondary | ICD-10-CM | POA: Diagnosis not present

## 2020-10-19 DIAGNOSIS — E113293 Type 2 diabetes mellitus with mild nonproliferative diabetic retinopathy without macular edema, bilateral: Secondary | ICD-10-CM | POA: Diagnosis not present

## 2020-10-19 DIAGNOSIS — H524 Presbyopia: Secondary | ICD-10-CM | POA: Diagnosis not present

## 2020-10-19 DIAGNOSIS — H5203 Hypermetropia, bilateral: Secondary | ICD-10-CM | POA: Diagnosis not present

## 2020-10-19 LAB — HM DIABETES EYE EXAM

## 2020-10-22 ENCOUNTER — Encounter: Payer: Self-pay | Admitting: Adult Health

## 2021-01-06 ENCOUNTER — Other Ambulatory Visit: Payer: Self-pay | Admitting: Adult Health

## 2021-03-19 ENCOUNTER — Encounter: Payer: Medicare Other | Admitting: Adult Health

## 2021-04-05 ENCOUNTER — Other Ambulatory Visit: Payer: Self-pay | Admitting: Adult Health

## 2021-04-07 ENCOUNTER — Other Ambulatory Visit: Payer: Self-pay | Admitting: Adult Health

## 2021-04-07 DIAGNOSIS — E118 Type 2 diabetes mellitus with unspecified complications: Secondary | ICD-10-CM

## 2021-04-15 ENCOUNTER — Other Ambulatory Visit: Payer: Self-pay

## 2021-04-16 ENCOUNTER — Ambulatory Visit (INDEPENDENT_AMBULATORY_CARE_PROVIDER_SITE_OTHER): Payer: Medicare Other | Admitting: Adult Health

## 2021-04-16 ENCOUNTER — Encounter: Payer: Self-pay | Admitting: Adult Health

## 2021-04-16 VITALS — BP 131/67 | HR 76 | Temp 98.5°F | Ht 61.75 in | Wt 113.0 lb

## 2021-04-16 DIAGNOSIS — Z0000 Encounter for general adult medical examination without abnormal findings: Secondary | ICD-10-CM

## 2021-04-16 DIAGNOSIS — Z23 Encounter for immunization: Secondary | ICD-10-CM

## 2021-04-16 DIAGNOSIS — M810 Age-related osteoporosis without current pathological fracture: Secondary | ICD-10-CM | POA: Diagnosis not present

## 2021-04-16 DIAGNOSIS — E118 Type 2 diabetes mellitus with unspecified complications: Secondary | ICD-10-CM | POA: Diagnosis not present

## 2021-04-16 DIAGNOSIS — E782 Mixed hyperlipidemia: Secondary | ICD-10-CM

## 2021-04-16 LAB — HEMOGLOBIN A1C: Hgb A1c MFr Bld: 6.1 % (ref 4.6–6.5)

## 2021-04-16 LAB — CBC WITH DIFFERENTIAL/PLATELET
Basophils Absolute: 0 10*3/uL (ref 0.0–0.1)
Basophils Relative: 0.3 % (ref 0.0–3.0)
Eosinophils Absolute: 0.1 10*3/uL (ref 0.0–0.7)
Eosinophils Relative: 0.9 % (ref 0.0–5.0)
HCT: 41.2 % (ref 36.0–46.0)
Hemoglobin: 14 g/dL (ref 12.0–15.0)
Lymphocytes Relative: 31.5 % (ref 12.0–46.0)
Lymphs Abs: 2.1 10*3/uL (ref 0.7–4.0)
MCHC: 34 g/dL (ref 30.0–36.0)
MCV: 93.3 fl (ref 78.0–100.0)
Monocytes Absolute: 0.5 10*3/uL (ref 0.1–1.0)
Monocytes Relative: 7 % (ref 3.0–12.0)
Neutro Abs: 4 10*3/uL (ref 1.4–7.7)
Neutrophils Relative %: 60.3 % (ref 43.0–77.0)
Platelets: 190 10*3/uL (ref 150.0–400.0)
RBC: 4.41 Mil/uL (ref 3.87–5.11)
RDW: 12.9 % (ref 11.5–15.5)
WBC: 6.7 10*3/uL (ref 4.0–10.5)

## 2021-04-16 LAB — COMPREHENSIVE METABOLIC PANEL
ALT: 15 U/L (ref 0–35)
AST: 20 U/L (ref 0–37)
Albumin: 4.9 g/dL (ref 3.5–5.2)
Alkaline Phosphatase: 50 U/L (ref 39–117)
BUN: 11 mg/dL (ref 6–23)
CO2: 30 mEq/L (ref 19–32)
Calcium: 10.2 mg/dL (ref 8.4–10.5)
Chloride: 102 mEq/L (ref 96–112)
Creatinine, Ser: 0.67 mg/dL (ref 0.40–1.20)
GFR: 84.74 mL/min (ref >60.00)
Glucose, Bld: 113 mg/dL — ABNORMAL HIGH (ref 70–99)
Potassium: 4.1 mEq/L (ref 3.5–5.1)
Sodium: 139 mEq/L (ref 135–145)
Total Bilirubin: 0.6 mg/dL (ref 0.2–1.2)
Total Protein: 8.3 g/dL (ref 6.0–8.3)

## 2021-04-16 LAB — LIPID PANEL
Cholesterol: 150 mg/dL (ref 0–200)
HDL: 66.2 mg/dL (ref >39.00)
LDL Cholesterol: 63 mg/dL (ref 0–99)
NonHDL: 83.32
Total CHOL/HDL Ratio: 2
Triglycerides: 100 mg/dL (ref 0.0–149.0)
VLDL: 20 mg/dL (ref 0.0–40.0)

## 2021-04-16 LAB — MICROALBUMIN / CREATININE URINE RATIO
Creatinine,U: 28.4 mg/dL
Microalb Creat Ratio: 2.5 mg/g (ref 0.0–30.0)
Microalb, Ur: <0.7 mg/dL (ref 0.0–1.9)

## 2021-04-16 LAB — TSH: TSH: 2.87 u[IU]/mL (ref 0.35–5.50)

## 2021-04-16 LAB — VITAMIN D 25 HYDROXY (VIT D DEFICIENCY, FRACTURES): VITD: 68.58 ng/mL (ref 30.00–100.00)

## 2021-04-16 NOTE — Patient Instructions (Addendum)
It was great seeing you today   We will follow up with you regarding your lab work   I will see you back in 6 months for a diabetic follow up

## 2021-04-16 NOTE — Progress Notes (Signed)
Subjective:    Patient ID: Susan Schmidt, female    DOB: [birth year], 75 y.o.   MRN: [id number]  HPI Patient presents for yearly preventative medicine examination. She is a pleasant 75 year old female who  has a past medical history of DM (diabetes mellitus), type 2, uncontrolled, Hyperlipidemia, and Osteoporosis.  DM -diet controlled.  She does monitor her blood sugars at home with readings consistently in the 120s to 130s.  She is staying active and eating a heart healthy diet Lab Results  Component Value Date   HGBA1C 5.7 (A) 09/15/2020   Vitamin D Def. -Takes  2000 units of vitamin D daily Last vitamin D Lab Results  Component Value Date   VD25OH 38 03/17/2020   Osteoporosis -last DEXA scan was in December 2021 with a T score of -3.4.  She has declined treatment with Prolia in the past due to side effects of medication.  Currently taking vitamin D and calcium supplements with Fosamax 70 mg weekly.   She did sustain a closed hip fracture back in September 2021.  Hyperlipidemia - takes Crestor 5 mg daily.   All immunizations and health maintenance protocols were reviewed with the patient and needed orders were placed.  Appropriate screening laboratory values were ordered for the patient including screening of hyperlipidemia, renal function and hepatic function.  Medication reconciliation,  past medical history, social history, problem list and allergies were reviewed in detail with the patient  Goals were established with regard to weight loss, exercise, and  diet in compliance with medications  End of life planning was discussed.  Review of Systems  Constitutional: Negative.   HENT: Negative.    Eyes: Negative.   Respiratory: Negative.    Cardiovascular: Negative.   Gastrointestinal: Negative.   Endocrine: Negative.   Genitourinary: Negative.   Musculoskeletal: Negative.   Skin: Negative.   Allergic/Immunologic: Negative.   Neurological: Negative.    Hematological: Negative.   Psychiatric/Behavioral: Negative.    All other systems reviewed and are negative.  Past Medical History:  Diagnosis Date   DM (diabetes mellitus), type 2, uncontrolled    Hyperlipidemia    Osteoporosis     Social History   Socioeconomic History   Marital status: Single    Spouse name: Not on file   Number of children: Not on file   Years of education: Not on file   Highest education level: Not on file  Occupational History   Not on file  Tobacco Use   Smoking status: Former    Packs/day: 2.00    Types: Cigarettes    Start date: 07/11/1964    Quit date: 07/11/1976    Years since quitting: 44.7   Smokeless tobacco: Never  Vaping Use   Vaping Use: Never used  Substance and Sexual Activity   Alcohol use: No   Drug use: Never   Sexual activity: Not Currently  Other Topics Concern   Not on file  Social History Narrative   Retired    Not married    No kids          Social Determinants of Health   Financial Resource Strain: Not on file  Food Insecurity: Not on file  Transportation Needs: Not on file  Physical Activity: Not on file  Stress: Not on file  Social Connections: Not on file  Intimate Partner Violence: Not on file    Past Surgical History:  Procedure Laterality Date   ANKLE SURGERY Right    COLONOSCOPY N/A 07/03/2019  Procedure: COLONOSCOPY;  Surgeon: Rogene Houston, MD;  [facility];  Service: Endoscopy;  Laterality: N/A;  830   INTRAMEDULLARY (IM) NAIL INTERTROCHANTERIC Left 03/27/2020   Procedure: INTRAMEDULLARY (IM) NAIL INTERTROCHANTRIC;  Surgeon: Carole Civil, MD;  Location: [facility];  Service: Orthopedics;  Laterality: Left;   pilonidal cyst      Family History  Problem Relation Age of Onset   Diabetes Mother    Hypertension Mother    Hypertension Father    Heart attack Father    Hypertension Sister    Diabetes Brother    Diabetes Sister     Allergies  Allergen Reactions   Strawberry  Extract Swelling    Current Outpatient Medications on File Prior to Visit  Medication Sig Dispense Refill   acetaminophen (TYLENOL) 325 MG tablet Take 325 mg by mouth as needed.     alendronate (FOSAMAX) 70 MG tablet Take 1 tablet (70 mg total) by mouth every 7 (seven) days. Take with a full glass of water on an empty stomach. 4 tablet 11   aspirin EC 81 MG tablet Take 81 mg by mouth daily. Swallow whole.     Biotin 10000 MCG TABS Take 1 tablet daily by mouth.     Blood Glucose Monitoring Suppl (ONE TOUCH ULTRA 2) w/Device KIT Test blood sugar twice daily 1 kit 0   Calcium Carbonate-Vit D-Min (CALCIUM-VITAMIN D-MINERALS) 600-800 MG-UNIT CHEW Chew 1 tablet daily by mouth.     Cholecalciferol (VITAMIN D) 50 MCG (2000 UT) tablet Take 2,000 Units by mouth daily.     Ferrous Sulfate (IRON) 325 (65 Fe) MG TABS Take by mouth.     GLUCOSAMINE-CHONDROITIN PO Take 1 tablet by mouth daily. 1500 mg glucosamine and 1200 mg of choindroitan     Lancets (ONETOUCH DELICA PLUS [id number]) MISC USE TO TEST BLOOD SUGAR TWICE DAILY AS DIRECTED. 100 each 0   Multiple Vitamins-Minerals (MULTIVITAMIN WOMEN 50+) TABS Take 1 tablet daily by mouth.     Omega-3 Fatty Acids (SUPER OMEGA 3 EPA/DHA PO) Take 660 mg by mouth daily.     ONETOUCH ULTRA test strip USE TO TEST BLOOD SUGAR TWICE DAILY AS DIRECTED. 100 strip 0   rosuvastatin (CRESTOR) 5 MG tablet TAKE 1 TABLET BY MOUTH ONCE DAILY. 90 tablet 0   vitamin E 180 MG (400 UNITS) capsule Take 400 Units by mouth daily.     No current facility-administered medications on file prior to visit.    BP 131/67   Pulse 76   Temp 98.5 F (36.9 C) (Oral)   Ht 5' 1.75" (1.568 m)   Wt 113 lb (51.3 kg)   SpO2 97%   BMI 20.84 kg/m        Objective:   Physical Exam Vitals and nursing note reviewed.  Constitutional:      General: She is not in acute distress.    Appearance: Normal appearance. She is well-developed. She is not ill-appearing.  HENT:     Head:  Normocephalic and atraumatic.     Right Ear: Tympanic membrane, ear canal and external ear normal. There is no impacted cerumen.     Left Ear: Tympanic membrane, ear canal and external ear normal. There is no impacted cerumen.     Nose: Nose normal. No congestion or rhinorrhea.     Mouth/Throat:     Mouth: Mucous membranes are moist.     Pharynx: Oropharynx is clear. No oropharyngeal exudate or posterior oropharyngeal erythema.  Eyes:  General:        Right eye: No discharge.        Left eye: No discharge.     Extraocular Movements: Extraocular movements intact.     Conjunctiva/sclera: Conjunctivae normal.     Pupils: Pupils are equal, round, and reactive to light.  Neck:     Thyroid: No thyromegaly.     Vascular: No carotid bruit.     Trachea: No tracheal deviation.  Cardiovascular:     Rate and Rhythm: Normal rate and regular rhythm.     Pulses: Normal pulses.     Heart sounds: Normal heart sounds. No murmur heard.   No friction rub. No gallop.  Pulmonary:     Effort: Pulmonary effort is normal. No respiratory distress.     Breath sounds: Normal breath sounds. No stridor. No wheezing, rhonchi or rales.  Chest:     Chest wall: No tenderness.  Abdominal:     General: Abdomen is flat. Bowel sounds are normal. There is no distension.     Palpations: Abdomen is soft. There is no mass.     Tenderness: There is no abdominal tenderness. There is no right CVA tenderness, left CVA tenderness, guarding or rebound.     Hernia: No hernia is present.  Musculoskeletal:        General: No swelling, tenderness, deformity or signs of injury. Normal range of motion.     Cervical back: Normal range of motion and neck supple.     Right lower leg: No edema.     Left lower leg: No edema.  Lymphadenopathy:     Cervical: No cervical adenopathy.  Skin:    General: Skin is warm and dry.     Capillary Refill: Capillary refill takes less than 2 seconds.     Coloration: Skin is not jaundiced or  pale.     Findings: No bruising, erythema, lesion or rash.  Neurological:     General: No focal deficit present.     Mental Status: She is alert and oriented to person, place, and time.     Cranial Nerves: No cranial nerve deficit.     Sensory: No sensory deficit.     Motor: No weakness.     Coordination: Coordination normal.     Gait: Gait abnormal (walking with cane).     Deep Tendon Reflexes: Reflexes normal.  Psychiatric:        Mood and Affect: Mood normal.        Behavior: Behavior normal.        Thought Content: Thought content normal.        Judgment: Judgment normal.       Assessment & Plan:  1. Routine general medical examination at a health care facility - Follow up in one year  - Continue to stay active and exercise  - CBC with Differential/Platelet; Future - Comprehensive metabolic panel; Future - Hemoglobin A1c; Future - Lipid panel; Future - TSH; Future  2. Controlled type 2 diabetes mellitus with complication, without long-term current use of insulin ([place]) - Consider adding metformin  - Six month follow up  - CBC with Differential/Platelet; Future - Comprehensive metabolic panel; Future - Hemoglobin A1c; Future - Lipid panel; Future - TSH; Future - Microalbumin/Creatinine Ratio, Urine; Future  3. Age-related osteoporosis without current pathological fracture - Continue Fosamax  - CBC with Differential/Platelet; Future - Comprehensive metabolic panel; Future - Hemoglobin A1c; Future - Lipid panel; Future - TSH; Future - VITAMIN D 25 Hydroxy (Vit-D  Deficiency, Fractures); Future  4. Mixed hyperlipidemia - Consider increase in statin  - CBC with Differential/Platelet; Future - Comprehensive metabolic panel; Future - Hemoglobin A1c; Future - Lipid panel; Future - TSH; Future  5. Need for immunization against influenza  - Flu Vaccine QUAD 23moIM (Fluarix, Fluzone & Alfiuria Quad PF)  CDorothyann Peng NP

## 2021-04-16 NOTE — Addendum Note (Signed)
Addended by: Kandra Nicolas on: 04/16/2021 11:08 AM   Modules accepted: Orders

## 2021-05-10 ENCOUNTER — Other Ambulatory Visit: Payer: Self-pay | Admitting: Adult Health

## 2021-05-13 DIAGNOSIS — Z23 Encounter for immunization: Secondary | ICD-10-CM | POA: Diagnosis not present

## 2021-05-25 ENCOUNTER — Telehealth: Payer: Self-pay | Admitting: Adult Health

## 2021-05-25 ENCOUNTER — Other Ambulatory Visit: Payer: Self-pay

## 2021-05-25 DIAGNOSIS — E118 Type 2 diabetes mellitus with unspecified complications: Secondary | ICD-10-CM

## 2021-05-25 MED ORDER — ONETOUCH ULTRA VI STRP: ORAL_STRIP | 0 refills | Status: DC

## 2021-05-25 MED ORDER — ONETOUCH DELICA PLUS LANCET33G MISC: 0 refills | Status: DC

## 2021-05-25 NOTE — Telephone Encounter (Signed)
Patient needs refills for Strips and Lancets for her One Touch meter.  Pharmacy- [facility]

## 2021-05-25 NOTE — Telephone Encounter (Signed)
Strips and lancets sent to pharmacy 

## 2021-07-05 ENCOUNTER — Other Ambulatory Visit: Payer: Self-pay | Admitting: Adult Health

## 2021-07-14 ENCOUNTER — Other Ambulatory Visit: Payer: Self-pay | Admitting: Adult Health

## 2021-07-14 DIAGNOSIS — E118 Type 2 diabetes mellitus with unspecified complications: Secondary | ICD-10-CM

## 2021-09-06 ENCOUNTER — Other Ambulatory Visit: Payer: Self-pay | Admitting: Adult Health

## 2021-09-06 DIAGNOSIS — E118 Type 2 diabetes mellitus with unspecified complications: Secondary | ICD-10-CM

## 2021-09-07 NOTE — Telephone Encounter (Signed)
Patient needs refills on ONETOUCH ULTRA test strip [Pharmacy Med Name: ONE TOUCH ULTRA TST STRIP]   Lancets (ONETOUCH DELICA PLUS [id number]) MISC [Pharmacy Med Name: ONE TOUCH PLUS DELICA LANCETS]     Please send to  [facility] - [place], [place] - [address] Phone:  [phone]  Fax:  [phone]        Please advise

## 2021-09-13 ENCOUNTER — Other Ambulatory Visit: Payer: Self-pay | Admitting: Adult Health

## 2021-09-13 DIAGNOSIS — Z1231 Encounter for screening mammogram for malignant neoplasm of breast: Secondary | ICD-10-CM

## 2021-09-29 ENCOUNTER — Ambulatory Visit
Admission: RE | Admit: 2021-09-29 | Discharge: 2021-09-29 | Disposition: A | Discharge status: Home / Self Care | Payer: Medicare Other | Source: Ambulatory Visit | Attending: Adult Health | Admitting: Adult Health

## 2021-09-29 DIAGNOSIS — Z1231 Encounter for screening mammogram for malignant neoplasm of breast: Secondary | ICD-10-CM

## 2021-10-13 ENCOUNTER — Ambulatory Visit (INDEPENDENT_AMBULATORY_CARE_PROVIDER_SITE_OTHER): Payer: Medicare Other | Admitting: Adult Health

## 2021-10-13 ENCOUNTER — Encounter: Payer: Self-pay | Admitting: Adult Health

## 2021-10-13 VITALS — BP 140/82 | HR 64 | Temp 98.9°F | Ht 61.75 in | Wt 113.0 lb

## 2021-10-13 DIAGNOSIS — E118 Type 2 diabetes mellitus with unspecified complications: Secondary | ICD-10-CM | POA: Diagnosis not present

## 2021-10-13 LAB — POCT GLYCOSYLATED HEMOGLOBIN (HGB A1C): Hemoglobin A1C: 5.8 % — AB (ref 4.0–5.6)

## 2021-10-13 NOTE — Progress Notes (Signed)
? ?Subjective:  ? ? Patient ID: Susan Schmidt, female    DOB: 1945/10/10, 76 y.o.   MRN: [id number] ? ?HPI ?76 year old female who  has a past medical history of DM (diabetes mellitus), type 2, uncontrolled, Hyperlipidemia, and Osteoporosis. ? ?She presents to the office today for 96-monthfollow-up regarding diabetes mellitus type 2.  She is currently diet controlled.  She does monitor her blood sugars at home with readings consistently in the 120s to 130s.  She does stay active and eats a heart healthy diet. ?Lab Results  ?Component Value Date  ? HGBA1C 6.1 04/16/2021  ? ?Review of Systems ?See HPI  ? ?Past Medical History:  ?Diagnosis Date  ? DM (diabetes mellitus), type 2, uncontrolled   ? Hyperlipidemia   ? Osteoporosis   ? ? ?Social History  ? ?Socioeconomic History  ? Marital status: Single  ?  Spouse name: Not on file  ? Number of children: Not on file  ? Years of education: Not on file  ? Highest education level: Not on file  ?Occupational History  ? Not on file  ?Tobacco Use  ? Smoking status: Former  ?  Packs/day: 2.00  ?  Types: Cigarettes  ?  Start date: 07/11/1964  ?  Quit date: 07/11/1976  ?  Years since quitting: 45.2  ? Smokeless tobacco: Never  ?Vaping Use  ? Vaping Use: Never used  ?Substance and Sexual Activity  ? Alcohol use: No  ? Drug use: Never  ? Sexual activity: Not Currently  ?Other Topics Concern  ? Not on file  ?Social History Narrative  ? Retired   ? Not married   ? No kids   ?   ?   ? ?Social Determinants of Health  ? ?Financial Resource Strain: Not on file  ?Food Insecurity: Not on file  ?Transportation Needs: Not on file  ?Physical Activity: Not on file  ?Stress: Not on file  ?Social Connections: Not on file  ?Intimate Partner Violence: Not on file  ? ? ?Past Surgical History:  ?Procedure Laterality Date  ? ANKLE SURGERY Right   ? COLONOSCOPY N/A 07/03/2019  ? Procedure: COLONOSCOPY;  Surgeon: RRogene Houston MD;  [facility]: [facility];  Service: Endoscopy;  Laterality: N/A;   830  ? INTRAMEDULLARY (IM) NAIL INTERTROCHANTERIC Left 03/27/2020  ? Procedure: INTRAMEDULLARY (IM) NAIL INTERTROCHANTRIC;  Surgeon: HCarole Civil MD;  Location: [facility];  Service: Orthopedics;  Laterality: Left;  ? pilonidal cyst    ? ? ?Family History  ?Problem Relation Age of Onset  ? Diabetes Mother   ? Hypertension Mother   ? Hypertension Father   ? Heart attack Father   ? Hypertension Sister   ? Diabetes Brother   ? Diabetes Sister   ? ? ?Allergies  ?Allergen Reactions  ? Strawberry Extract Swelling  ? ? ?Current Outpatient Medications on File Prior to Visit  ?Medication Sig Dispense Refill  ? acetaminophen (TYLENOL) 325 MG tablet Take 325 mg by mouth as needed.    ? alendronate (FOSAMAX) 70 MG tablet TAKE 1 TABLET EVERY WEEK IN THE MORNING 30 MIN BEFORE EATING WITH AN 8OZ GLASS OF WATER (SIT UP 30 MIN) 4 tablet 3  ? aspirin EC 81 MG tablet Take 81 mg by mouth daily. Swallow whole.    ? Biotin 10000 MCG TABS Take 1 tablet daily by mouth.    ? Blood Glucose Monitoring Suppl (ONE TOUCH ULTRA 2) w/Device KIT Test blood sugar twice daily 1  kit 0  ? Calcium Carbonate-Vit D-Min (CALCIUM-VITAMIN D-MINERALS) 600-800 MG-UNIT CHEW Chew 1 tablet daily by mouth.    ? Cholecalciferol (VITAMIN D) 50 MCG (2000 UT) tablet Take 2,000 Units by mouth daily.    ? Ferrous Sulfate (IRON) 325 (65 Fe) MG TABS Take by mouth.    ? GLUCOSAMINE-CHONDROITIN PO Take 1 tablet by mouth daily. 1500 mg glucosamine and 1200 mg of choindroitan    ? Lancets (ONETOUCH DELICA PLUS [id number]) MISC USE TO TEST BLOOD SUGAR TWICE DAILY AS DIRECTED. 100 each 0  ? Multiple Vitamins-Minerals (MULTIVITAMIN WOMEN 50+) TABS Take 1 tablet daily by mouth.    ? Omega-3 Fatty Acids (SUPER OMEGA 3 EPA/DHA PO) Take 660 mg by mouth daily.    ? ONETOUCH ULTRA test strip USE TO TEST BLOOD SUGAR TWICE DAILY AS DIRECTED. 100 strip 0  ? rosuvastatin (CRESTOR) 5 MG tablet TAKE 1 TABLET BY MOUTH ONCE DAILY. 90 tablet 6  ? vitamin E 180 MG (400 UNITS) capsule Take 400  Units by mouth daily.    ? ?No current facility-administered medications on file prior to visit.  ? ? ?There were no vitals taken for this visit. ? ? ?   ?Objective:  ? Physical Exam ?Vitals and nursing note reviewed.  ?Constitutional:   ?   Appearance: Normal appearance.  ?Cardiovascular:  ?   Rate and Rhythm: Normal rate and regular rhythm.  ?   Pulses: Normal pulses.  ?   Heart sounds: Normal heart sounds.  ?Pulmonary:  ?   Breath sounds: Normal breath sounds.  ?Skin: ?   General: Skin is warm and dry.  ?Neurological:  ?   General: No focal deficit present.  ?   Mental Status: She is alert and oriented to person, place, and time.  ?Psychiatric:     ?   Mood and Affect: Mood normal.     ?   Behavior: Behavior normal.     ?   Thought Content: Thought content normal.     ?   Judgment: Judgment normal.  ? ? ?   ?Assessment & Plan:  ?1. Controlled type 2 diabetes mellitus with complication, without long-term current use of insulin (HCC) ?- POC HgB A1c- 5.8  ?- Continue to stay active and eat healthy  ?- Follow up in 6 months for CPE  ? ?Dorothyann Peng, NP ? ? ? ?

## 2021-10-15 ENCOUNTER — Ambulatory Visit: Payer: Medicare Other | Admitting: Adult Health

## 2021-10-25 DIAGNOSIS — H2513 Age-related nuclear cataract, bilateral: Secondary | ICD-10-CM | POA: Diagnosis not present

## 2021-10-25 DIAGNOSIS — H5203 Hypermetropia, bilateral: Secondary | ICD-10-CM | POA: Diagnosis not present

## 2021-10-25 DIAGNOSIS — H25013 Cortical age-related cataract, bilateral: Secondary | ICD-10-CM | POA: Diagnosis not present

## 2021-10-25 DIAGNOSIS — E113293 Type 2 diabetes mellitus with mild nonproliferative diabetic retinopathy without macular edema, bilateral: Secondary | ICD-10-CM | POA: Diagnosis not present

## 2021-10-25 LAB — HM DIABETES EYE EXAM

## 2021-10-27 ENCOUNTER — Other Ambulatory Visit: Payer: Self-pay | Admitting: Adult Health

## 2021-10-27 DIAGNOSIS — E118 Type 2 diabetes mellitus with unspecified complications: Secondary | ICD-10-CM

## 2021-10-28 ENCOUNTER — Encounter: Payer: Self-pay | Admitting: Adult Health

## 2021-11-24 ENCOUNTER — Other Ambulatory Visit: Payer: Self-pay | Admitting: Adult Health

## 2021-12-16 ENCOUNTER — Other Ambulatory Visit: Payer: Self-pay | Admitting: Adult Health

## 2021-12-16 DIAGNOSIS — E118 Type 2 diabetes mellitus with unspecified complications: Secondary | ICD-10-CM

## 2021-12-22 ENCOUNTER — Other Ambulatory Visit: Payer: Self-pay | Admitting: Adult Health

## 2022-01-19 ENCOUNTER — Other Ambulatory Visit: Payer: Self-pay | Admitting: Adult Health

## 2022-02-17 ENCOUNTER — Other Ambulatory Visit: Payer: Self-pay | Admitting: Adult Health

## 2022-03-16 ENCOUNTER — Other Ambulatory Visit: Payer: Self-pay | Admitting: Adult Health

## 2022-04-12 ENCOUNTER — Other Ambulatory Visit: Payer: Self-pay | Admitting: Adult Health

## 2022-04-19 ENCOUNTER — Encounter: Payer: Self-pay | Admitting: Adult Health

## 2022-04-19 ENCOUNTER — Ambulatory Visit (INDEPENDENT_AMBULATORY_CARE_PROVIDER_SITE_OTHER): Payer: Medicare Other | Admitting: Adult Health

## 2022-04-19 VITALS — BP 138/82 | HR 68 | Temp 98.1°F | Wt 121.8 lb

## 2022-04-19 DIAGNOSIS — E118 Type 2 diabetes mellitus with unspecified complications: Secondary | ICD-10-CM

## 2022-04-19 DIAGNOSIS — Z0000 Encounter for general adult medical examination without abnormal findings: Secondary | ICD-10-CM | POA: Diagnosis not present

## 2022-04-19 DIAGNOSIS — E782 Mixed hyperlipidemia: Secondary | ICD-10-CM | POA: Diagnosis not present

## 2022-04-19 DIAGNOSIS — Z23 Encounter for immunization: Secondary | ICD-10-CM | POA: Diagnosis not present

## 2022-04-19 DIAGNOSIS — M810 Age-related osteoporosis without current pathological fracture: Secondary | ICD-10-CM | POA: Diagnosis not present

## 2022-04-19 LAB — CBC WITH DIFFERENTIAL/PLATELET
Basophils Absolute: 0 10*3/uL (ref 0.0–0.1)
Basophils Relative: 0.5 % (ref 0.0–3.0)
Eosinophils Absolute: 0.1 10*3/uL (ref 0.0–0.7)
Eosinophils Relative: 0.8 % (ref 0.0–5.0)
HCT: 41 % (ref 36.0–46.0)
Hemoglobin: 14.1 g/dL (ref 12.0–15.0)
Lymphocytes Relative: 22.6 % (ref 12.0–46.0)
Lymphs Abs: 1.6 10*3/uL (ref 0.7–4.0)
MCHC: 34.4 g/dL (ref 30.0–36.0)
MCV: 94 fl (ref 78.0–100.0)
Monocytes Absolute: 0.5 10*3/uL (ref 0.1–1.0)
Monocytes Relative: 7.3 % (ref 3.0–12.0)
Neutro Abs: 4.8 10*3/uL (ref 1.4–7.7)
Neutrophils Relative %: 68.8 % (ref 43.0–77.0)
Platelets: 193 10*3/uL (ref 150.0–400.0)
RBC: 4.36 Mil/uL (ref 3.87–5.11)
RDW: 12.9 % (ref 11.5–15.5)
WBC: 7 10*3/uL (ref 4.0–10.5)

## 2022-04-19 LAB — COMPREHENSIVE METABOLIC PANEL
ALT: 21 U/L (ref 0–35)
AST: 26 U/L (ref 0–37)
Albumin: 4.9 g/dL (ref 3.5–5.2)
Alkaline Phosphatase: 50 U/L (ref 39–117)
BUN: 10 mg/dL (ref 6–23)
CO2: 28 mEq/L (ref 19–32)
Calcium: 10.5 mg/dL (ref 8.4–10.5)
Chloride: 101 mEq/L (ref 96–112)
Creatinine, Ser: 0.61 mg/dL (ref 0.40–1.20)
GFR: 86.07 mL/min (ref >60.00)
Glucose, Bld: 119 mg/dL — ABNORMAL HIGH (ref 70–99)
Potassium: 4.1 mEq/L (ref 3.5–5.1)
Sodium: 139 mEq/L (ref 135–145)
Total Bilirubin: 0.8 mg/dL (ref 0.2–1.2)
Total Protein: 8.3 g/dL (ref 6.0–8.3)

## 2022-04-19 LAB — POCT GLYCOSYLATED HEMOGLOBIN (HGB A1C): Hemoglobin A1C: 6 % — AB (ref 4.0–5.6)

## 2022-04-19 LAB — LIPID PANEL
Cholesterol: 172 mg/dL (ref 0–200)
HDL: 71.8 mg/dL (ref >39.00)
LDL Cholesterol: 76 mg/dL (ref 0–99)
NonHDL: 99.99
Total CHOL/HDL Ratio: 2
Triglycerides: 121 mg/dL (ref 0.0–149.0)
VLDL: 24.2 mg/dL (ref 0.0–40.0)

## 2022-04-19 LAB — MICROALBUMIN / CREATININE URINE RATIO
Creatinine,U: 35.3 mg/dL
Microalb Creat Ratio: 2 mg/g (ref 0.0–30.0)
Microalb, Ur: 0.7 mg/dL (ref 0.0–1.9)

## 2022-04-19 LAB — TSH: TSH: 2.85 u[IU]/mL (ref 0.35–5.50)

## 2022-04-19 MED ORDER — ALENDRONATE SODIUM 70 MG PO TABS: ORAL_TABLET | ORAL | 3 refills | Status: DC

## 2022-04-19 NOTE — Patient Instructions (Addendum)
It was great seeing you today   We will follow up with you regarding your lab work   Please let me know if you need anything   Please follow up with me in 6 months for diabetic check

## 2022-04-19 NOTE — Progress Notes (Signed)
Subjective:    Patient ID: Susan Schmidt, female    DOB: [birth year], 76 y.o.   MRN: [id number]  HPI Patient presents for yearly preventative medicine examination. She is a pleasant 76 year old female who  has a past medical history of DM (diabetes mellitus), type 2, uncontrolled, Hyperlipidemia, and Osteoporosis.  Diabetes mellitus type 2-diet controlled.  She does monitor her blood sugars at home with readings consistently in the 120s to 130s.  She does stay active and eat a heart healthy diet Lab Results  Component Value Date   HGBA1C 5.8 (A) 10/13/2021   Vitamin D deficiency-takes 2000 units of vitamin D daily  Hyperlipidemia-managed with Crestor 5 mg daily.  She denies myalgia or fatigue  Lab Results  Component Value Date   CHOL 150 04/16/2021   HDL 66.20 04/16/2021   LDLCALC 63 04/16/2021   TRIG 100.0 04/16/2021   CHOLHDL 2 04/16/2021   Osteoporosis-last DEXA scan was in December 2021 with a T score of -3.4.  She has declined treatment with Prolia in the past due to side effects of medication.  She is currently taking vitamin D and calcium supplements with Fosamax 70 mg weekly.   All immunizations and health maintenance protocols were reviewed with the patient and needed orders were placed.  Appropriate screening laboratory values were ordered for the patient including screening of hyperlipidemia, renal function and hepatic function.  Medication reconciliation,  past medical history, social history, problem list and allergies were reviewed in detail with the patient  Goals were established with regard to weight loss, exercise, and  diet in compliance with medications Wt Readings from Last 3 Encounters:  04/19/22 121 lb 12.8 oz (55.2 kg)  10/13/21 113 lb (51.3 kg)  04/16/21 113 lb (51.3 kg)    Review of Systems  Constitutional: Negative.   HENT: Negative.    Eyes: Negative.   Respiratory: Negative.    Cardiovascular: Negative.   Gastrointestinal: Negative.    Endocrine: Negative.   Genitourinary: Negative.   Musculoskeletal:  Positive for arthralgias and gait problem.  Skin: Negative.   Allergic/Immunologic: Negative.   Hematological: Negative.   Psychiatric/Behavioral: Negative.         Objective:   Physical Exam Vitals and nursing note reviewed.  Constitutional:      General: She is not in acute distress.    Appearance: Normal appearance. She is well-developed. She is not ill-appearing.  HENT:     Head: Normocephalic and atraumatic.     Right Ear: Tympanic membrane, ear canal and external ear normal. There is no impacted cerumen.     Left Ear: Tympanic membrane, ear canal and external ear normal. There is no impacted cerumen.     Nose: Nose normal. No congestion or rhinorrhea.     Mouth/Throat:     Mouth: Mucous membranes are moist.     Pharynx: Oropharynx is clear. No oropharyngeal exudate or posterior oropharyngeal erythema.  Eyes:     General:        Right eye: No discharge.        Left eye: No discharge.     Extraocular Movements: Extraocular movements intact.     Conjunctiva/sclera: Conjunctivae normal.     Pupils: Pupils are equal, round, and reactive to light.  Neck:     Thyroid: No thyromegaly.     Vascular: No carotid bruit.     Trachea: No tracheal deviation.  Cardiovascular:     Rate and Rhythm: Normal rate and regular rhythm.  Pulses: Normal pulses.     Heart sounds: Normal heart sounds. No murmur heard.    No friction rub. No gallop.  Pulmonary:     Effort: Pulmonary effort is normal. No respiratory distress.     Breath sounds: Normal breath sounds. No stridor. No wheezing, rhonchi or rales.  Chest:     Chest wall: No tenderness.  Abdominal:     General: Abdomen is flat. Bowel sounds are normal. There is no distension.     Palpations: Abdomen is soft. There is no mass.     Tenderness: There is no abdominal tenderness. There is no right CVA tenderness, left CVA tenderness, guarding or rebound.      Hernia: No hernia is present.  Musculoskeletal:        General: No swelling, tenderness, deformity or signs of injury. Normal range of motion.     Cervical back: Normal range of motion and neck supple.     Right lower leg: No edema.     Left lower leg: No edema.  Lymphadenopathy:     Cervical: No cervical adenopathy.  Skin:    General: Skin is warm and dry.     Coloration: Skin is not jaundiced or pale.     Findings: No bruising, erythema, lesion or rash.  Neurological:     General: No focal deficit present.     Mental Status: She is alert and oriented to person, place, and time.     Cranial Nerves: No cranial nerve deficit.     Sensory: No sensory deficit.     Motor: Weakness (walks with cane) present.     Coordination: Coordination normal.     Gait: Gait normal.     Deep Tendon Reflexes: Reflexes normal.  Psychiatric:        Mood and Affect: Mood normal.        Behavior: Behavior normal.        Thought Content: Thought content normal.        Judgment: Judgment normal.       Assessment & Plan:  1. Routine general medical examination at a health care facility  - CBC with Differential/Platelet; Future - Comprehensive metabolic panel; Future - Lipid panel; Future - TSH; Future - Microalbumin/Creatinine Ratio, Urine; Future  2. Controlled type 2 diabetes mellitus with complication, without long-term current use of insulin (HCC) - POC A1c - 6.0  - CBC with Differential/Platelet; Future - Comprehensive metabolic panel; Future - Lipid panel; Future - TSH; Future - Microalbumin/Creatinine Ratio, Urine; Future  3. Age-related osteoporosis without current pathological fracture - Continue with Fosamax  - CBC with Differential/Platelet; Future - Comprehensive metabolic panel; Future - Lipid panel; Future - TSH; Future - Microalbumin/Creatinine Ratio, Urine; Future - VITAMIN D 25 Hydroxy (Vit-D Deficiency, Fractures); Future - DG Bone Density; Future  4. Mixed  hyperlipidemia - Consider increase in statin  - CBC with Differential/Platelet; Future - Comprehensive metabolic panel; Future - Lipid panel; Future - TSH; Future - Microalbumin/Creatinine Ratio, Urine; Future  5. Need for influenza vaccination  - Flu Vaccine QUAD High Dose(Fluad)  Shirline Frees, NP

## 2022-04-20 LAB — VITAMIN D 25 HYDROXY (VIT D DEFICIENCY, FRACTURES): VITD: 75.25 ng/mL (ref 30.00–100.00)

## 2022-04-29 ENCOUNTER — Other Ambulatory Visit: Payer: Self-pay | Admitting: Adult Health

## 2022-04-29 DIAGNOSIS — Z139 Encounter for screening, unspecified: Secondary | ICD-10-CM

## 2022-05-06 ENCOUNTER — Telehealth: Payer: Self-pay | Admitting: Adult Health

## 2022-05-06 NOTE — Telephone Encounter (Signed)
Pt returning call regarding results, receive a negative contact letter

## 2022-05-09 NOTE — Telephone Encounter (Signed)
Spoke with the patient and informed her of the results.

## 2022-05-23 ENCOUNTER — Other Ambulatory Visit: Payer: Self-pay | Admitting: Adult Health

## 2022-05-23 DIAGNOSIS — E118 Type 2 diabetes mellitus with unspecified complications: Secondary | ICD-10-CM

## 2022-07-07 ENCOUNTER — Ambulatory Visit
Admission: EM | Admit: 2022-07-07 | Discharge: 2022-07-07 | Disposition: A | Discharge status: Home / Self Care | Payer: Medicare Other | Attending: Nurse Practitioner | Admitting: Nurse Practitioner

## 2022-07-07 ENCOUNTER — Encounter: Payer: Self-pay | Admitting: Emergency Medicine

## 2022-07-07 DIAGNOSIS — M25551 Pain in right hip: Secondary | ICD-10-CM | POA: Diagnosis not present

## 2022-07-07 DIAGNOSIS — W19XXXA Unspecified fall, initial encounter: Secondary | ICD-10-CM

## 2022-07-07 NOTE — ED Triage Notes (Signed)
Fell in floor trying to get up on Wednesday (a week ago) from a chair  Pain to right hip.  Larey Seat previously a week ago and hurt left hip.  No pain on left side, just a bruise.  Pain currently to right hip and buttocks area.  States pain starts when walking.

## 2022-07-07 NOTE — ED Provider Notes (Signed)
[facility]    CSN: [id number] Arrival date & time: 07/07/22  3846      History   Chief Complaint No chief complaint on file.   HPI Susan Schmidt is a 76 y.o. female.   The history is provided by the patient and a relative.   The patient presents with her sister for complaints of right hip pain.  Patient states symptoms started after a fall approximately 1 week ago.  Patient states that she was at home, and fell onto her floor.  She states since that time, she has had difficulty ambulating with only her cane, that she has started using a walker.  Pain is located in the buttock, and she reports decreased range of motion of the right hip.  Patient does have a history of osteoporosis.  She states that she has fractured the left hip that required surgery.  Patient states that she did fall approximately 2 weeks ago onto the left hip, but denies pain, continues to have a bruise to that hip.  She reports she has not been taking any medication for her symptoms.  She denies loss of bowel or bladder function, or radiation of pain.  Past Medical History:  Diagnosis Date   DM (diabetes mellitus), type 2, uncontrolled    Hyperlipidemia    Osteoporosis     Patient Active Problem List   Diagnosis Date Noted   Fall    Acute blood loss as cause of postoperative anemia    Closed left hip fracture (HCC) s/p left hip IM nail  03/25/2020   Special screening for malignant neoplasms, colon 04/05/2019   Type 2 diabetes mellitus without complication, without long-term current use of insulin ([place]) 01/04/2019   Hyperlipidemia    Age-related osteoporosis without current pathological fracture 01/01/2018   Essential hypertension 01/01/2018    Past Surgical History:  Procedure Laterality Date   ANKLE SURGERY Right    COLONOSCOPY N/A 07/03/2019   Procedure: COLONOSCOPY;  Surgeon: Rogene Houston, MD;  Location: [facility];  Service: Endoscopy;  Laterality: N/A;  830    INTRAMEDULLARY (IM) NAIL INTERTROCHANTERIC Left 03/27/2020   Procedure: INTRAMEDULLARY (IM) NAIL INTERTROCHANTRIC;  Surgeon: Carole Civil, MD;  Location: [facility];  Service: Orthopedics;  Laterality: Left;   pilonidal cyst      OB History   No obstetric history on file.      Home Medications    Prior to Admission medications   Medication Sig Start Date End Date Taking? Authorizing Provider  acetaminophen (TYLENOL) 325 MG tablet Take 325 mg by mouth as needed.    [provider]  alendronate (FOSAMAX) 70 MG tablet TAKE 1 TABLET EVERY WEEK IN THE MORNING 30 MIN BEFORE EATING WITH AN 8OZ GLASS OF WATER (SIT UP 30 MIN) 04/19/22   Nafziger, Tommi Rumps, NP  aspirin EC 81 MG tablet Take 81 mg by mouth daily. Swallow whole.    [provider]  Biotin 10000 MCG TABS Take 1 tablet daily by mouth.    [provider]  Blood Glucose Monitoring Suppl (ONE TOUCH ULTRA 2) w/Device KIT Test blood sugar twice daily 09/15/20   Nafziger, Tommi Rumps, NP  Calcium Carbonate-Vit D-Min (CALCIUM-VITAMIN D-MINERALS) 600-800 MG-UNIT CHEW Chew 1 tablet daily by mouth.    [provider]  Cholecalciferol (VITAMIN D) 50 MCG (2000 UT) tablet Take 2,000 Units by mouth daily.    [provider]  Ferrous Sulfate (IRON) 325 (65 Fe) MG TABS Take by mouth.  [provider]  GLUCOSAMINE-CHONDROITIN PO Take 1 tablet by mouth daily. 1500 mg glucosamine and 1200 mg of choindroitan    [provider]  Lancets (ONETOUCH DELICA PLUS [id number]) MISC USE TO TEST BLOOD SUGAR TWICE DAILY AS DIRECTED. 05/24/22   Nafziger, Tommi Rumps, NP  Multiple Vitamins-Minerals (MULTIVITAMIN WOMEN 50+) TABS Take 1 tablet daily by mouth.    [provider]  Omega-3 Fatty Acids (SUPER OMEGA 3 EPA/DHA PO) Take 660 mg by mouth daily.    [provider]  [facility] ULTRA test strip USE TO TEST BLOOD SUGAR TWICE DAILY AS DIRECTED. 05/24/22   Nafziger, Tommi Rumps, NP  rosuvastatin (CRESTOR) 5 MG  tablet TAKE 1 TABLET BY MOUTH ONCE DAILY. 07/06/21   Nafziger, Tommi Rumps, NP  vitamin C (ASCORBIC ACID) 500 MG tablet Take 500 mg by mouth daily.    [provider]  vitamin E 180 MG (400 UNITS) capsule Take 400 Units by mouth daily.    [provider]    Family History Family History  Problem Relation Age of Onset   Diabetes Mother    Hypertension Mother    Hypertension Father    Heart attack Father    Hypertension Sister    Diabetes Brother    Diabetes Sister     Social History Social History   Tobacco Use   Smoking status: Former    Packs/day: 2.00    Types: Cigarettes    Start date: 07/11/1964    Quit date: 07/11/1976    Years since quitting: 46.0   Smokeless tobacco: Never  Vaping Use   Vaping Use: Never used  Substance Use Topics   Alcohol use: No   Drug use: Never     Allergies   Strawberry extract   Review of Systems Review of Systems Per HPI  Physical Exam Triage Vital Signs ED Triage Vitals  Enc Vitals Group     BP 07/07/22 1207 (!) 187/74     Pulse Rate 07/07/22 1207 84     Resp 07/07/22 1207 18     Temp 07/07/22 1207 98.2 F (36.8 C)     Temp Source 07/07/22 1207 Oral     SpO2 07/07/22 1207 92 %     Weight --      Height --      Head Circumference --      Peak Flow --      Pain Score 07/07/22 1210 8     Pain Loc --      Pain Edu? --      Excl. in [place]? --    No data found.  Updated Vital Signs BP (!) 187/74 (BP Location: Right Arm)   Pulse 84   Temp 98.2 F (36.8 C) (Oral)   Resp 18   SpO2 92%   Visual Acuity Right Eye Distance:   Left Eye Distance:   Bilateral Distance:    Right Eye Near:   Left Eye Near:    Bilateral Near:     Physical Exam Vitals and nursing note reviewed.  Constitutional:      Appearance: Normal appearance.  Musculoskeletal:     Left hip: Tenderness present. No deformity or bony tenderness. Decreased range of motion.     Comments: Noted to the inguinal region, along the inguinal ligament   She also reports pain.  Tenderness is also noted to the femoral head of the right hip.  No bruising or obvious deformity noted.  There is no variation or shortening of the right leg.  Skin:    General: Skin is warm and dry.  Neurological:     General: No focal deficit present.     Mental Status: She is alert and oriented to person, place, and time.  Psychiatric:        Mood and Affect: Mood normal.        Behavior: Behavior normal.      UC Treatments / Results  Labs (all labs ordered are listed, but only abnormal results are displayed) Labs Reviewed - No data to display  EKG   Radiology No results found.  Procedures Procedures (including critical care time)  Medications Ordered in UC Medications - No data to display  Initial Impression / Assessment and Plan / UC Course  I have reviewed the triage vital signs and the nursing notes.  Pertinent labs & imaging results that were available during my care of the patient were reviewed by me and considered in my medical decision making (see chart for details).  The patient is well-appearing, she is in no acute distress, she is hypertensive, but vital signs are otherwise stable.  Patient with right hip pain status post fall.  Unable to perform imaging due to staffing.  Patient has elected to go to [facility] for imaging.  Patient plans to go have the imaging performed on 07/08/2022.  Patient was advised that she will be contacted once the results are received.  In the interim, patient was advised to take over-the-counter Tylenol arthritis Schmidt as needed for pain or discomfort, and gentle range of motion exercises while symptoms persist.  Patient was advised that she may be referred to orthopedics depending on the finding of the x-ray.  Patient was given strict ER precautions and indications of when follow-up may be necessary.  Patient and sister verbalized understanding, all questions were answered.  Patient is stable for  discharge.  (Information for follow-up regarding x-ray was provided to [facility] once the results are received) Final Clinical Impressions(s) / UC Diagnoses   Final diagnoses:  Right hip pain  Fall, initial encounter     Discharge Instructions      Please go to [facility] on 07/08/2022 for x-rays of your hip.  You will be contacted once the x-rays are received to discuss the findings. May take over-the-counter Tylenol arthritis Schmidt as needed for pain or discomfort. Recommend gentle range of motion exercises for the right hip while symptoms persist. If you experience worsening pain, become unable to walk, or have any other concerns, please go to the emergency department for further evaluation. Follow-up as needed.     ED Prescriptions   None    PDMP not reviewed this encounter.   Tish Men, NP 07/07/22 1307

## 2022-07-07 NOTE — Discharge Instructions (Addendum)
Please go to [facility] on 07/08/2022 for x-rays of your hip.  You will be contacted once the x-rays are received to discuss the findings. May take over-the-counter Tylenol arthritis strength as needed for pain or discomfort. Recommend gentle range of motion exercises for the right hip while symptoms persist. If you experience worsening pain, become unable to walk, or have any other concerns, please go to the emergency department for further evaluation. Follow-up as needed.

## 2022-07-08 ENCOUNTER — Ambulatory Visit (HOSPITAL_COMMUNITY)
Admission: RE | Admit: 2022-07-08 | Discharge: 2022-07-08 | Disposition: A | Discharge status: Home / Self Care | Payer: Medicare Other | Source: Ambulatory Visit | Attending: Nurse Practitioner | Admitting: Nurse Practitioner

## 2022-07-08 DIAGNOSIS — S32511A Fracture of superior rim of right pubis, initial encounter for closed fracture: Secondary | ICD-10-CM | POA: Insufficient documentation

## 2022-07-08 DIAGNOSIS — W19XXXA Unspecified fall, initial encounter: Secondary | ICD-10-CM | POA: Insufficient documentation

## 2022-07-08 DIAGNOSIS — X58XXXA Exposure to other specified factors, initial encounter: Secondary | ICD-10-CM | POA: Insufficient documentation

## 2022-07-08 DIAGNOSIS — Y929 Unspecified place or not applicable: Secondary | ICD-10-CM | POA: Insufficient documentation

## 2022-07-08 DIAGNOSIS — M25551 Pain in right hip: Secondary | ICD-10-CM | POA: Insufficient documentation

## 2022-07-08 DIAGNOSIS — Y939 Activity, unspecified: Secondary | ICD-10-CM | POA: Diagnosis not present

## 2022-07-13 ENCOUNTER — Encounter: Payer: Self-pay | Admitting: Orthopedic Surgery

## 2022-07-13 ENCOUNTER — Ambulatory Visit: Payer: Medicare Other | Admitting: Orthopedic Surgery

## 2022-07-13 VITALS — BP 128/74 | HR 87 | Ht 61.75 in | Wt 120.0 lb

## 2022-07-13 DIAGNOSIS — S32591A Other specified fracture of right pubis, initial encounter for closed fracture: Secondary | ICD-10-CM | POA: Diagnosis not present

## 2022-07-13 NOTE — Patient Instructions (Signed)
You have fractured your pelvis  It does not need surgery  You are allowed to walk with your walker weight-bear as tolerated  You can increase her activities as tolerated  You will need some medication for pain for the first few weeks post injury and after that please take Tylenol 500 mg every 6 hours as needed  You will be scheduled for an x-ray on around 31 January

## 2022-07-13 NOTE — Progress Notes (Signed)
Patient ID: Susan Schmidt, female   DOB: [birth year], 77 y.o.   MRN: [id number]  SUMMARY AND PLAN  77 year old female presents with a right superior pubic ramus fracture on December 31.  This is post injury day #3 apparently she fell 2 weeks prior to her admission to the ER (ER visit 07/07/2022)  The plan is to allow weightbearing as tolerated  Use medication as needed for pain  X-ray at 6 weeks post injury   Chief Complaint  Patient presents with   Hip Pain    Has painful right hip pain since fall 06/29/22 and 06/24/22    HPI  77 year old female who had a left gamma nail placed for left hip fracture has had 2 falls on around 14 December presented to the ER on the 28th with pain in the right side which was worked up and found to be right superior pubic ramus fracture  Patient is ambulatory with a walker using Tylenol for pain and quite comfortable.  She does have a bruise on the left hip from the fall.    Allergies  Allergen Reactions   Strawberry Extract Swelling    Current Outpatient Medications  Medication Instructions   acetaminophen (TYLENOL) 325 mg, Oral, As needed   alendronate (FOSAMAX) 70 MG tablet TAKE 1 TABLET EVERY WEEK IN THE MORNING 30 MIN BEFORE EATING WITH AN 8OZ GLASS OF WATER (SIT UP 30 MIN)   ascorbic acid (VITAMIN C) 500 mg, Oral, Daily   aspirin EC 81 mg, Oral, Daily, Swallow whole.   Biotin 10000 MCG TABS 1 tablet, Oral, Daily   Blood Glucose Monitoring Suppl (ONE TOUCH ULTRA 2) w/Device KIT Test blood sugar twice daily   Calcium Carbonate-Vit D-Min (CALCIUM-VITAMIN D-MINERALS) 600-800 MG-UNIT CHEW 1 tablet, Oral, Daily   Ferrous Sulfate (IRON) 325 (65 Fe) MG TABS Oral   GLUCOSAMINE-CHONDROITIN PO 1 tablet, Oral, Daily, 1500 mg glucosamine and 1200 mg of choindroitan    Lancets (ONETOUCH DELICA PLUS [id number]) MISC USE TO TEST BLOOD SUGAR TWICE DAILY AS DIRECTED.   Multiple Vitamins-Minerals (MULTIVITAMIN WOMEN 50+) TABS 1 tablet, Oral, Daily    Omega-3 Fatty Acids (SUPER OMEGA 3 EPA/DHA PO) 660 mg, Oral, Daily   ONETOUCH ULTRA test strip USE TO TEST BLOOD SUGAR TWICE DAILY AS DIRECTED.   rosuvastatin (CRESTOR) 5 MG tablet TAKE 1 TABLET BY MOUTH ONCE DAILY.   Vitamin D 2,000 Units, Oral, Daily   vitamin E 400 Units, Oral, Daily     Review of Systems Review of Systems  Respiratory: Negative.    Cardiovascular: Negative.      Past Medical History:  Diagnosis Date   DM (diabetes mellitus), type 2, uncontrolled    Hyperlipidemia    Osteoporosis     Past Surgical History:  Procedure Laterality Date   ANKLE SURGERY Right    COLONOSCOPY N/A 07/03/2019   Procedure: COLONOSCOPY;  Surgeon: Rogene Houston, MD;  Location: [facility];  Service: Endoscopy;  Laterality: N/A;  830   INTRAMEDULLARY (IM) NAIL INTERTROCHANTERIC Left 03/27/2020   Procedure: INTRAMEDULLARY (IM) NAIL INTERTROCHANTRIC;  Surgeon: Carole Civil, MD;  Location: [facility];  Service: Orthopedics;  Laterality: Left;   pilonidal cyst      Family History  Problem Relation Age of Onset   Diabetes Mother    Hypertension Mother    Hypertension Father    Heart attack Father    Hypertension Sister    Diabetes Brother    Diabetes Sister      Social History  Tobacco Use   Smoking status: Former    Packs/day: 2.00    Types: Cigarettes    Start date: 07/11/1964    Quit date: 07/11/1976    Years since quitting: 46.0   Smokeless tobacco: Never  Vaping Use   Vaping Use: Never used  Substance Use Topics   Alcohol use: No   Drug use: Never    Allergies  Allergen Reactions   Strawberry Extract Swelling    Current Outpatient Medications  Medication Sig Dispense Refill   acetaminophen (TYLENOL) 325 MG tablet Take 325 mg by mouth as needed.     alendronate (FOSAMAX) 70 MG tablet TAKE 1 TABLET EVERY WEEK IN THE MORNING 30 MIN BEFORE EATING WITH AN 8OZ GLASS OF WATER (SIT UP 30 MIN) 12 tablet 3   aspirin EC 81 MG tablet Take 81 mg by mouth daily.  Swallow whole.     Biotin 10000 MCG TABS Take 1 tablet daily by mouth.     Blood Glucose Monitoring Suppl (ONE TOUCH ULTRA 2) w/Device KIT Test blood sugar twice daily 1 kit 0   Calcium Carbonate-Vit D-Min (CALCIUM-VITAMIN D-MINERALS) 600-800 MG-UNIT CHEW Chew 1 tablet daily by mouth.     Cholecalciferol (VITAMIN D) 50 MCG (2000 UT) tablet Take 2,000 Units by mouth daily.     Ferrous Sulfate (IRON) 325 (65 Fe) MG TABS Take by mouth.     GLUCOSAMINE-CHONDROITIN PO Take 1 tablet by mouth daily. 1500 mg glucosamine and 1200 mg of choindroitan     Lancets (ONETOUCH DELICA PLUS [id number]) MISC USE TO TEST BLOOD SUGAR TWICE DAILY AS DIRECTED. 100 each 0   Multiple Vitamins-Minerals (MULTIVITAMIN WOMEN 50+) TABS Take 1 tablet daily by mouth.     Omega-3 Fatty Acids (SUPER OMEGA 3 EPA/DHA PO) Take 660 mg by mouth daily.     ONETOUCH ULTRA test strip USE TO TEST BLOOD SUGAR TWICE DAILY AS DIRECTED. 100 strip 0   rosuvastatin (CRESTOR) 5 MG tablet TAKE 1 TABLET BY MOUTH ONCE DAILY. 90 tablet 6   vitamin C (ASCORBIC ACID) 500 MG tablet Take 500 mg by mouth daily.     vitamin E 180 MG (400 UNITS) capsule Take 400 Units by mouth daily.     No current facility-administered medications for this visit.       Physical Exam BP 128/74   Pulse 87   Ht 5' 1.75" (1.568 m)   Wt 120 lb (54.4 kg)   BMI 22.13 kg/m    General appearance normal  Alert and oriented 3  Mood and affect normal    Upper extremities: Normal alignment  Ambulatory status: LIMP: y/n no significant limp but she is using a walker  lumbar spine exam noncontributory  Examination of the right hip hip reveals no tenderness over the greater trochanter  Hip flexion is normal Internal rotation is mild pain External rotation is mild discomfort Hip stability is normal  Log roll maneuver normal Push pull test is normal  Hip flexion strength 5/5 Leg lengths are equal  Overall alignment is normal Neurovascular examination  normal sensation in the 2 lower extremities with normal pulses and perfusion, no peripheral edema, normal color   The groin area has normal lymph nodes   The opposite hip  normal alignment no tenderness normal range of motion stability and strength skin bruise just inferior to the left greater trochanter again pulses normal groin lymph nodes are negative sensation is normal to soft touch  MEDICAL DECISION SECTION  OUTSIDE NOTES (Y/N) emergency  room  SUMMARY OF OUTSIDE NOTES: Pelvic fracture right superior pubic ramus from a fall  Prior x-ray reports are included by reference  Imaging: I will interpret outside images separately right superior pubic ramus fracture minimal displacement  Encounter Diagnosis  Name Primary?   Closed fracture of ramus of right pubis, initial encounter ([place]) Yes     PLAN:   No orders of the defined types were placed in this encounter.   Return in 4 weeks for x-ray 6 weeks post injury  Weight-bear as tolerated

## 2022-07-14 ENCOUNTER — Other Ambulatory Visit: Payer: Self-pay | Admitting: Adult Health

## 2022-07-14 DIAGNOSIS — E118 Type 2 diabetes mellitus with unspecified complications: Secondary | ICD-10-CM

## 2022-08-09 DIAGNOSIS — S32591D Other specified fracture of right pubis, subsequent encounter for fracture with routine healing: Secondary | ICD-10-CM | POA: Insufficient documentation

## 2022-08-10 ENCOUNTER — Ambulatory Visit (INDEPENDENT_AMBULATORY_CARE_PROVIDER_SITE_OTHER): Payer: Medicare Other | Admitting: Orthopedic Surgery

## 2022-08-10 ENCOUNTER — Encounter: Payer: Self-pay | Admitting: Orthopedic Surgery

## 2022-08-10 ENCOUNTER — Ambulatory Visit (INDEPENDENT_AMBULATORY_CARE_PROVIDER_SITE_OTHER): Payer: Medicare Other

## 2022-08-10 DIAGNOSIS — M419 Scoliosis, unspecified: Secondary | ICD-10-CM

## 2022-08-10 DIAGNOSIS — S32591D Other specified fracture of right pubis, subsequent encounter for fracture with routine healing: Secondary | ICD-10-CM

## 2022-08-10 MED ORDER — MELOXICAM 7.5 MG PO TABS: 7.5000 mg | ORAL_TABLET | Freq: Every day | ORAL | 5 refills | Status: DC

## 2022-08-10 MED ORDER — TIZANIDINE HCL 4 MG PO TABS: 4.0000 mg | ORAL_TABLET | Freq: Every day | ORAL | 1 refills | Status: DC

## 2022-08-10 NOTE — Progress Notes (Signed)
Fracture care follow-up  Pubic ramus fracture - right side   The patient is 77 years old she sustained a fracture of the pelvis which was nonoperative  Date of injury was December 31  Plan return in 4 weeks for the 6-week post injury x-ray weight-bear as tolerated progressive activity as tolerated  Chief Complaint  Patient presents with   Hip Pain    Fracture pelvis 07/10/22   She complains of pain on the left side  No symptoms on the right  She describes decreased pain in the left hip stent is post intramedullary nailing 2021 September with some weakness and difficulty with gait  Physical Exam Constitutional:      Appearance: Normal appearance.  Musculoskeletal:     Comments: Right lower extremity  Normal palpation of the pelvis normal range of motion with active and passive testing  Tenderness in the left-sided lower back with weak hip flexion    Neurological:     General: No focal deficit present.     Mental Status: She is alert and oriented to person, place, and time. Mental status is at baseline.     Motor: Weakness present.     Gait: Gait abnormal.  Psychiatric:        Mood and Affect: Mood normal.    Encounter Diagnoses  Name Primary?   Closed fracture of ramus of right pubis with routine healing, subsequent encounter 07/07/22    Scoliosis of lumbar spine, unspecified scoliosis type Yes    Assessment and plan the pelvic fracture healed as evidenced by today's x-ray.  She does have degenerative scoliosis of the lumbar spine primarily on the left side with no issues with the nail on x-ray  Recommend medication and follow-up in a month this appears to be from degenerative scoliosis  Meds ordered this encounter  Medications   meloxicam (MOBIC) 7.5 MG tablet    Sig: Take 1 tablet (7.5 mg total) by mouth daily.    Dispense:  30 tablet    Refill:  5   tiZANidine (ZANAFLEX) 4 MG tablet    Sig: Take 1 tablet (4 mg total) by mouth daily.    Dispense:  30 tablet     Refill:  1

## 2022-09-01 IMAGING — MG MM DIGITAL SCREENING BILAT W/ TOMO AND CAD
8 series · 9 of 24 positions shown · non-contrast
Comparison: Previous exam(s).

CLINICAL DATA: Screening.

EXAM:
DIGITAL SCREENING BILATERAL MAMMOGRAM WITH TOMOSYNTHESIS AND CAD
TECHNIQUE: Bilateral screening digital craniocaudal and mediolateral oblique
mammograms were obtained. Bilateral screening digital breast
tomosynthesis was performed. The images were evaluated with
computer-aided detection.

[L CC synth-2D]
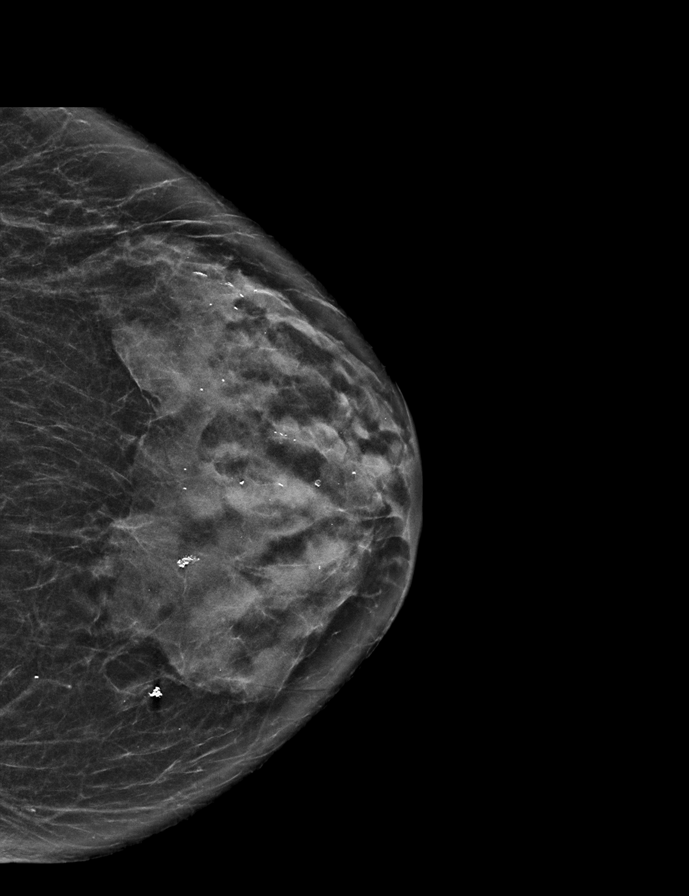

[L MLO synth-2D]
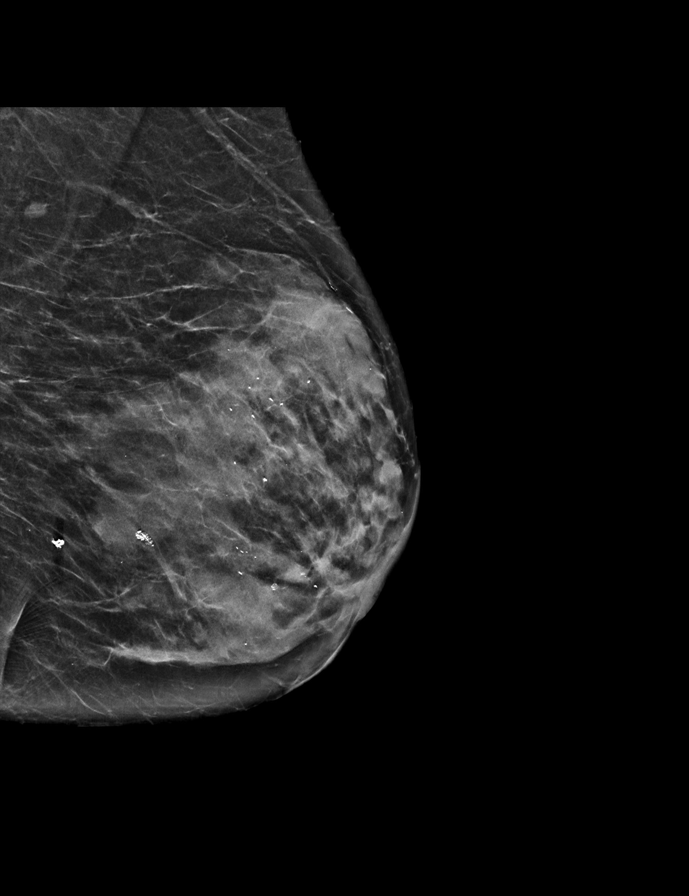

[R MLO synth-2D]
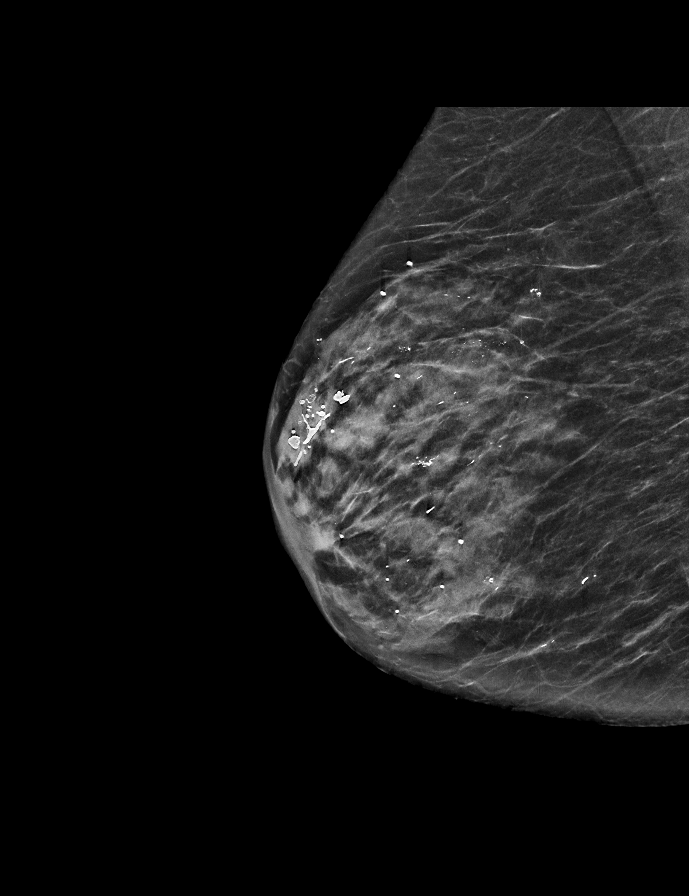

[R CC synth-2D]
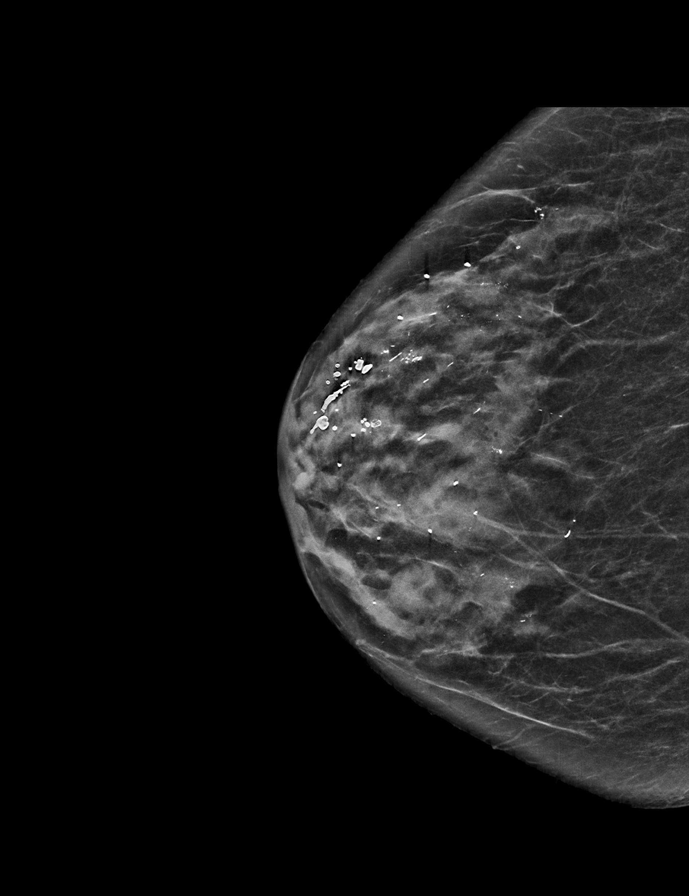

[L MLO tomo · 2 of 54 frames shown]
[frame 18/54]
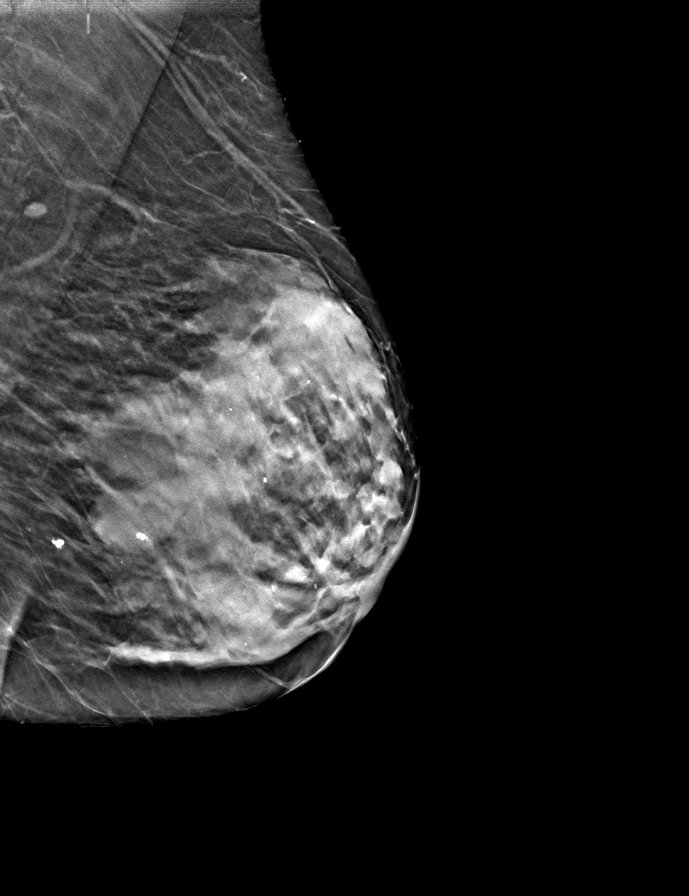
[frame 27/54]
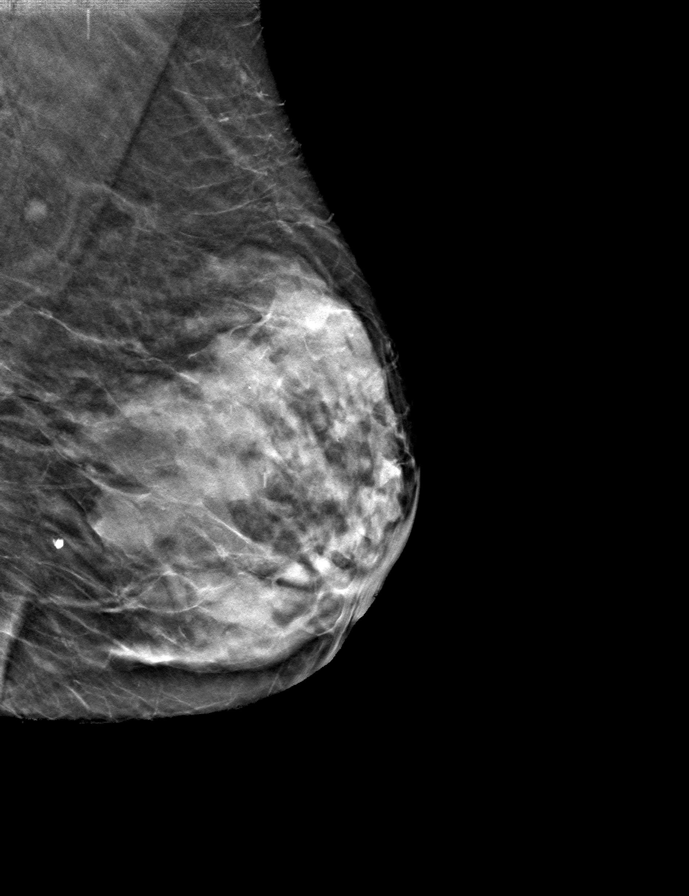

[L CC tomo · tomo slice 27/52.0]
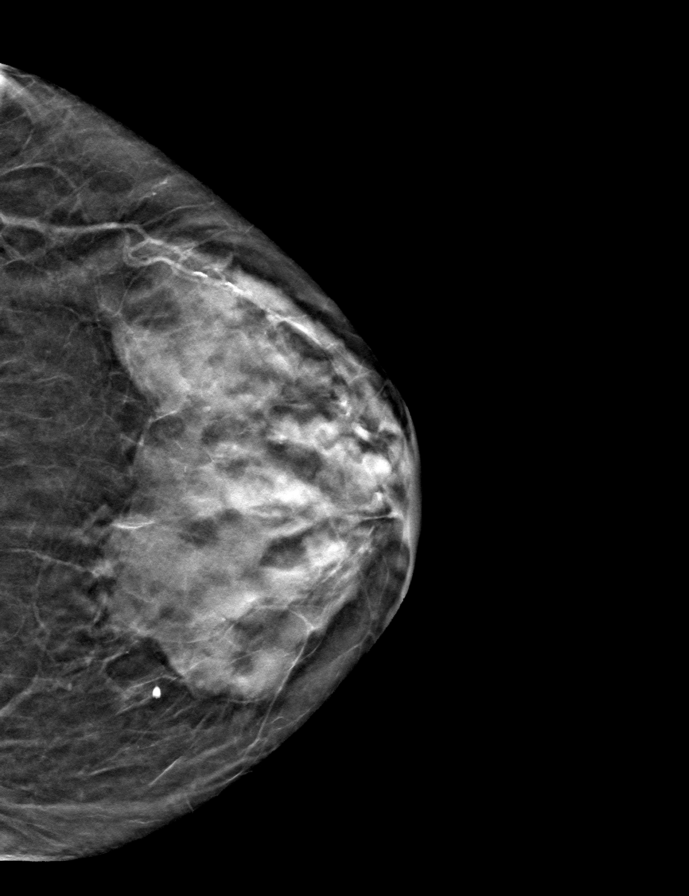

[R MLO tomo · tomo slice 27/52.0]
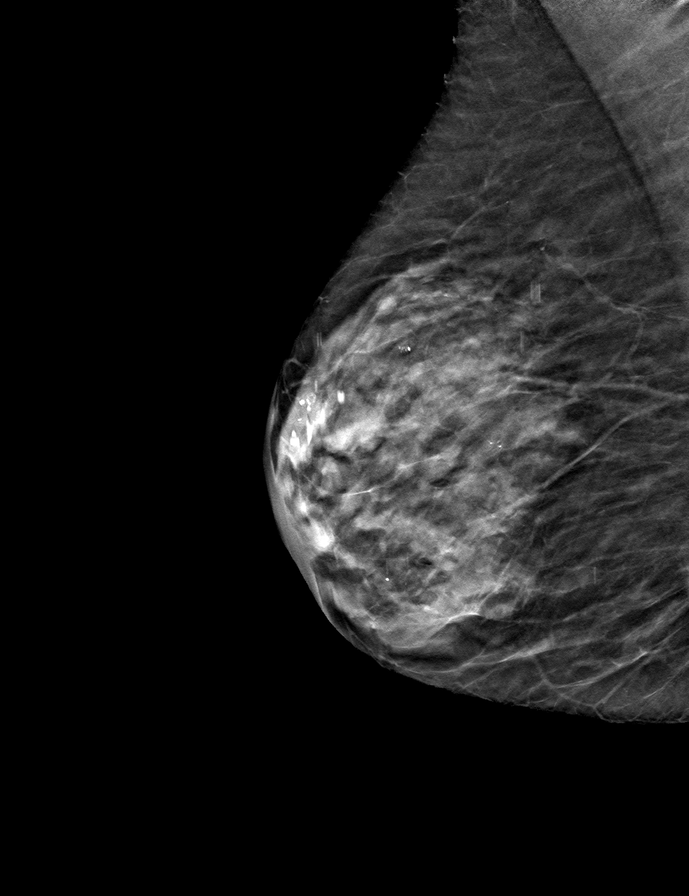

[R CC tomo · tomo slice 26/51.0]
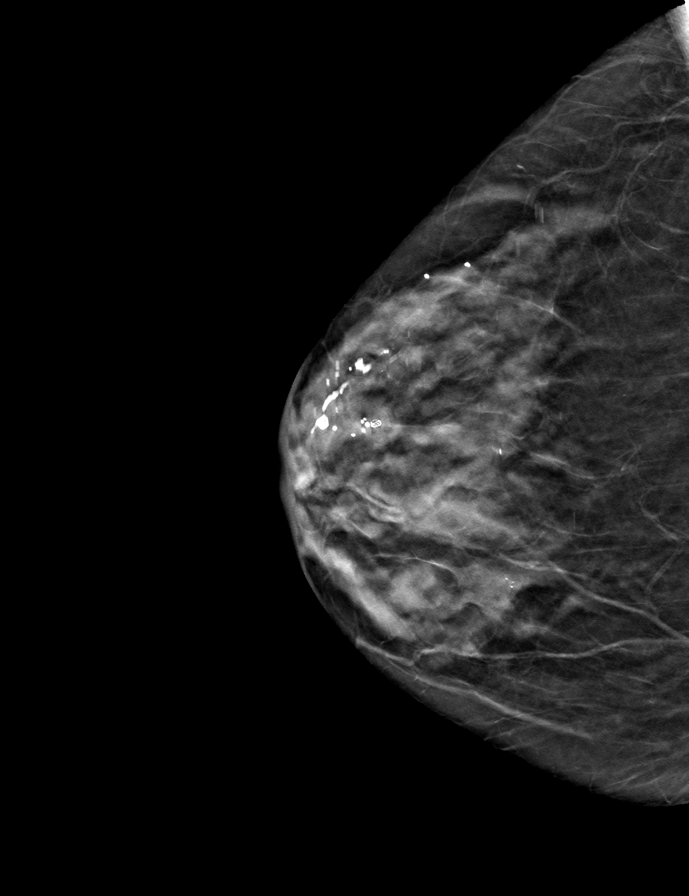

[9 of 24 positions shown; findings below may reference images not displayed]

ACR Breast Density Category c: The breast tissue is heterogeneously
dense, which may obscure small masses.
FINDINGS: There are no findings suspicious for malignancy.
IMPRESSION: No mammographic evidence of malignancy. A result letter of this
screening mammogram will be mailed directly to the patient.

RECOMMENDATION:
Screening mammogram in one year. (Code:Q3-W-BC3)

BI-RADS CATEGORY  1: Negative.

## 2022-09-08 ENCOUNTER — Ambulatory Visit (INDEPENDENT_AMBULATORY_CARE_PROVIDER_SITE_OTHER): Payer: Medicare Other | Admitting: Orthopedic Surgery

## 2022-09-08 ENCOUNTER — Encounter: Payer: Self-pay | Admitting: Radiology

## 2022-09-08 ENCOUNTER — Encounter: Payer: Self-pay | Admitting: Orthopedic Surgery

## 2022-09-08 DIAGNOSIS — M419 Scoliosis, unspecified: Secondary | ICD-10-CM

## 2022-09-08 DIAGNOSIS — S32591D Other specified fracture of right pubis, subsequent encounter for fracture with routine healing: Secondary | ICD-10-CM

## 2022-09-08 MED ORDER — MELOXICAM 7.5 MG PO TABS: 7.5000 mg | ORAL_TABLET | Freq: Every day | ORAL | 5 refills | Status: DC

## 2022-09-08 MED ORDER — TIZANIDINE HCL 4 MG PO TABS: 4.0000 mg | ORAL_TABLET | Freq: Every day | ORAL | 1 refills | Status: DC

## 2022-09-08 NOTE — Progress Notes (Signed)
Chief Complaint  Patient presents with   Back Pain    States not much improvement/ finishes meds today    Hip Pain    Pelvic fracture 07/10/22   History of pelvic fracture and recent back pain thought to be secondary to degenerative scoliosis  Patient has been treated with tizanidine and meloxicam  She is still using her walker she is uncomfortable going back to a cane but is not having any pelvic or groin pain  She still has some lower back pain but says she is okay as long she takes the medication  She would like a refill on the meloxicam which is fine we will also add some tizanidine for muscle spasms  Patient is released to follow-up as needed continue walker  Meds ordered this encounter  Medications   tiZANidine (ZANAFLEX) 4 MG tablet    Sig: Take 1 tablet (4 mg total) by mouth daily.    Dispense:  30 tablet    Refill:  1   meloxicam (MOBIC) 7.5 MG tablet    Sig: Take 1 tablet (7.5 mg total) by mouth daily.    Dispense:  30 tablet    Refill:  5

## 2022-09-28 ENCOUNTER — Other Ambulatory Visit: Payer: Self-pay | Admitting: Adult Health

## 2022-09-28 DIAGNOSIS — E118 Type 2 diabetes mellitus with unspecified complications: Secondary | ICD-10-CM

## 2022-10-12 ENCOUNTER — Ambulatory Visit
Admission: RE | Admit: 2022-10-12 | Discharge: 2022-10-12 | Disposition: A | Discharge status: Home / Self Care | Payer: Medicare Other | Source: Ambulatory Visit | Attending: Adult Health | Admitting: Adult Health

## 2022-10-12 DIAGNOSIS — Z780 Asymptomatic menopausal state: Secondary | ICD-10-CM | POA: Diagnosis not present

## 2022-10-12 DIAGNOSIS — M810 Age-related osteoporosis without current pathological fracture: Secondary | ICD-10-CM | POA: Diagnosis not present

## 2022-10-12 DIAGNOSIS — Z139 Encounter for screening, unspecified: Secondary | ICD-10-CM

## 2022-10-12 DIAGNOSIS — Z1231 Encounter for screening mammogram for malignant neoplasm of breast: Secondary | ICD-10-CM | POA: Diagnosis not present

## 2022-10-19 ENCOUNTER — Encounter: Payer: Self-pay | Admitting: Adult Health

## 2022-10-19 ENCOUNTER — Ambulatory Visit (INDEPENDENT_AMBULATORY_CARE_PROVIDER_SITE_OTHER): Payer: Medicare Other | Admitting: Adult Health

## 2022-10-19 VITALS — BP 140/88 | HR 82 | Temp 98.1°F | Ht 61.75 in | Wt 134.0 lb

## 2022-10-19 DIAGNOSIS — I10 Essential (primary) hypertension: Secondary | ICD-10-CM | POA: Diagnosis not present

## 2022-10-19 DIAGNOSIS — E118 Type 2 diabetes mellitus with unspecified complications: Secondary | ICD-10-CM | POA: Diagnosis not present

## 2022-10-19 LAB — POCT GLYCOSYLATED HEMOGLOBIN (HGB A1C): Hemoglobin A1C: 6.8 % — AB (ref 4.0–5.6)

## 2022-10-19 NOTE — Progress Notes (Signed)
Subjective:    Patient ID: Susan Schmidt, female    DOB: 03-26-1946, 77 y.o.   MRN: [id number]  HPI 77 year old female who  has a past medical history of DM (diabetes mellitus), type 2, uncontrolled, Hyperlipidemia, and Osteoporosis.  She presents to the office today for follow up regarding DM and HTN   She presents to the office today for 4769-month follow-up regarding diabetes mellitus type 2.  She is currently diet controlled.  She does monitor her blood sugars at home with readings " sometimes good and sometimes bad, she cannot remember her numbers. She is eating healthy but her activity level has decreased after a pelvis fracture. Lab Results  Component Value Date   HGBA1C 6.8 (A) 10/19/2022   Hypertension - has been controlled without medication in the past. She has not been checking her blood pressure at home   BP Readings from Last 3 Encounters:  10/19/22 (!) 140/88  07/13/22 128/74  07/07/22 (!) 187/74     Review of Systems See HPI   Past Medical History:  Diagnosis Date   DM (diabetes mellitus), type 2, uncontrolled    Hyperlipidemia    Osteoporosis     Social History   Socioeconomic History   Marital status: Single    Spouse name: Not on file   Number of children: Not on file   Years of education: Not on file   Highest education level: Not on file  Occupational History   Not on file  Tobacco Use   Smoking status: Former    Packs/day: 2    Types: Cigarettes    Start date: 07/11/1964    Quit date: 07/11/1976    Years since quitting: 46.3   Smokeless tobacco: Never  Vaping Use   Vaping Use: Never used  Substance and Sexual Activity   Alcohol use: No   Drug use: Never   Sexual activity: Not Currently  Other Topics Concern   Not on file  Social History Narrative   Retired    Not married    No kids          Social Determinants of Health   Financial Resource Strain: Not on file  Food Insecurity: Not on file  Transportation Needs: Not on  file  Physical Activity: Not on file  Stress: Not on file  Social Connections: Not on file  Intimate Partner Violence: Not on file    Past Surgical History:  Procedure Laterality Date   ANKLE SURGERY Right    COLONOSCOPY N/A 07/03/2019   Procedure: COLONOSCOPY;  Surgeon: Malissa Hippoehman, Najeeb U, MD;  Location: [facility];  Service: Endoscopy;  Laterality: N/A;  830   INTRAMEDULLARY (IM) NAIL INTERTROCHANTERIC Left 03/27/2020   Procedure: INTRAMEDULLARY (IM) NAIL INTERTROCHANTRIC;  Surgeon: Vickki HearingHarrison, Stanley E, MD;  Location: [facility];  Service: Orthopedics;  Laterality: Left;   pilonidal cyst      Family History  Problem Relation Age of Onset   Diabetes Mother    Hypertension Mother    Hypertension Father    Heart attack Father    Hypertension Sister    Diabetes Brother    Diabetes Sister     Allergies  Allergen Reactions   Strawberry Extract Swelling    Current Outpatient Medications on File Prior to Visit  Medication Sig Dispense Refill   acetaminophen (TYLENOL) 325 MG tablet Take 325 mg by mouth as needed.     alendronate (FOSAMAX) 70 MG tablet TAKE 1 TABLET EVERY WEEK IN THE  MORNING 30 MIN BEFORE EATING WITH AN 8OZ GLASS OF WATER (SIT UP 30 MIN) 12 tablet 3   aspirin EC 81 MG tablet Take 81 mg by mouth daily. Swallow whole.     Biotin 52841 MCG TABS Take 1 tablet daily by mouth.     Blood Glucose Monitoring Suppl (ONE TOUCH ULTRA 2) w/Device KIT Test blood sugar twice daily 1 kit 0   Calcium Carbonate-Vit D-Min (CALCIUM-VITAMIN D-MINERALS) 600-800 MG-UNIT CHEW Chew 1 tablet daily by mouth.     Cholecalciferol (VITAMIN D) 50 MCG (2000 UT) tablet Take 2,000 Units by mouth daily.     Ferrous Sulfate (IRON) 325 (65 Fe) MG TABS Take by mouth.     GLUCOSAMINE-CHONDROITIN PO Take 1 tablet by mouth daily. 1500 mg glucosamine and 1200 mg of choindroitan     Lancets (ONETOUCH DELICA PLUS [id number]) MISC USE TO TEST BLOOD SUGAR TWICE DAILY AS DIRECTED. 100 each 0   meloxicam (MOBIC)  7.5 MG tablet Take 1 tablet (7.5 mg total) by mouth daily. 30 tablet 5   Multiple Vitamins-Minerals (MULTIVITAMIN WOMEN 50+) TABS Take 1 tablet daily by mouth.     Omega-3 Fatty Acids (SUPER OMEGA 3 EPA/DHA PO) Take 660 mg by mouth daily.     ONETOUCH ULTRA test strip USE TO TEST BLOOD SUGAR TWICE DAILY AS DIRECTED. 100 strip 0   rosuvastatin (CRESTOR) 5 MG tablet TAKE 1 TABLET BY MOUTH ONCE DAILY. 90 tablet 0   tiZANidine (ZANAFLEX) 4 MG tablet Take 1 tablet (4 mg total) by mouth daily. 30 tablet 1   vitamin C (ASCORBIC ACID) 500 MG tablet Take 500 mg by mouth daily.     vitamin E 180 MG (400 UNITS) capsule Take 400 Units by mouth daily.     No current facility-administered medications on file prior to visit.    BP (!) 140/88   Pulse 82   Temp 98.1 F (36.7 C) (Oral)   Ht 5' 1.75" (1.568 m)   Wt 134 lb (60.8 kg)   SpO2 94%   BMI 24.71 kg/m       Objective:   Physical Exam Vitals and nursing note reviewed.  Constitutional:      Appearance: Normal appearance.  Cardiovascular:     Rate and Rhythm: Normal rate and regular rhythm.     Pulses: Normal pulses.     Heart sounds: Normal heart sounds.  Pulmonary:     Effort: Pulmonary effort is normal.     Breath sounds: Normal breath sounds.  Musculoskeletal:        General: Normal range of motion.  Skin:    General: Skin is warm and dry.  Neurological:     General: No focal deficit present.     Mental Status: She is alert and oriented to person, place, and time.  Psychiatric:        Mood and Affect: Mood normal.        Behavior: Behavior normal.        Thought Content: Thought content normal.        Judgment: Judgment normal.       Assessment & Plan:  1. Controlled type 2 diabetes mellitus with complication, without long-term current use of insulin  - POC HgB A1c- 6.8  - Has increased. Will have her work on activity and diet before placing on medication due to age  - Follow up in 6 months   2. Essential  hypertension - Will have her check her BP at home and  she will get back with me via mychart if BP elevated above 140/80 at home   Shirline Frees, NP

## 2022-10-19 NOTE — Patient Instructions (Addendum)
It was great seeing you today   Your A1c went up slightly to 6.8   We will keep an eye on this, I will see you back in 6 month for your physical exam   Let me know if your blood pressure is elevated at home

## 2022-12-29 ENCOUNTER — Other Ambulatory Visit: Payer: Self-pay | Admitting: Adult Health

## 2023-01-05 ENCOUNTER — Other Ambulatory Visit: Payer: Self-pay | Admitting: Adult Health

## 2023-01-05 DIAGNOSIS — E118 Type 2 diabetes mellitus with unspecified complications: Secondary | ICD-10-CM

## 2023-03-31 ENCOUNTER — Other Ambulatory Visit: Payer: Self-pay | Admitting: Adult Health

## 2023-03-31 ENCOUNTER — Telehealth: Payer: Self-pay | Admitting: Adult Health

## 2023-03-31 DIAGNOSIS — E118 Type 2 diabetes mellitus with unspecified complications: Secondary | ICD-10-CM

## 2023-03-31 NOTE — Telephone Encounter (Signed)
Prescription Request  03/31/2023  LOV: 10/19/2022  What is the name of the medication or equipment?   Lancets (ONETOUCH DELICA PLUS LANCET33G) MISC ONETOUCH ULTRA test strip rosuvastatin (CRESTOR) 5 MG tablet  (Pt has been out of this Rx for several days now)    Have you contacted your pharmacy to request a refill? Yes   Which pharmacy would you like this sent to?  [facility] - [place], [place] - [address] [place] [address] Phone: [phone] Fax: [phone]    Patient notified that their request is being sent to the clinical staff for review and that they should receive a response within 2 business days.   Please advise at Mobile [phone] (mobile)

## 2023-04-03 NOTE — Telephone Encounter (Signed)
Pt sister lois is calling and she is aware refills can take up to 3 business days

## 2023-04-04 NOTE — Telephone Encounter (Signed)
These medications have been refilled.

## 2023-04-25 ENCOUNTER — Other Ambulatory Visit: Payer: Self-pay | Admitting: Adult Health

## 2023-04-27 ENCOUNTER — Other Ambulatory Visit: Payer: Self-pay | Admitting: Adult Health

## 2023-04-27 ENCOUNTER — Other Ambulatory Visit: Payer: Medicare Other

## 2023-04-27 ENCOUNTER — Encounter: Payer: Self-pay | Admitting: Adult Health

## 2023-04-27 ENCOUNTER — Ambulatory Visit: Payer: Medicare Other | Admitting: Adult Health

## 2023-04-27 VITALS — BP 160/70 | HR 83 | Temp 98.3°F | Ht 61.0 in | Wt 146.0 lb

## 2023-04-27 DIAGNOSIS — E782 Mixed hyperlipidemia: Secondary | ICD-10-CM

## 2023-04-27 DIAGNOSIS — E118 Type 2 diabetes mellitus with unspecified complications: Secondary | ICD-10-CM

## 2023-04-27 DIAGNOSIS — Z23 Encounter for immunization: Secondary | ICD-10-CM | POA: Diagnosis not present

## 2023-04-27 DIAGNOSIS — M810 Age-related osteoporosis without current pathological fracture: Secondary | ICD-10-CM | POA: Diagnosis not present

## 2023-04-27 DIAGNOSIS — I10 Essential (primary) hypertension: Secondary | ICD-10-CM

## 2023-04-27 DIAGNOSIS — R4189 Other symptoms and signs involving cognitive functions and awareness: Secondary | ICD-10-CM | POA: Diagnosis not present

## 2023-04-27 DIAGNOSIS — Z0000 Encounter for general adult medical examination without abnormal findings: Secondary | ICD-10-CM

## 2023-04-27 LAB — CBC WITH DIFFERENTIAL/PLATELET
Basophils Absolute: 0 10*3/uL (ref 0.0–0.1)
Basophils Relative: 0.2 % (ref 0.0–3.0)
Eosinophils Absolute: 0 10*3/uL (ref 0.0–0.7)
Eosinophils Relative: 0.4 % (ref 0.0–5.0)
HCT: 42.8 % (ref 36.0–46.0)
Hemoglobin: 14.1 g/dL (ref 12.0–15.0)
Lymphocytes Relative: 12.1 % (ref 12.0–46.0)
Lymphs Abs: 1.5 10*3/uL (ref 0.7–4.0)
MCHC: 32.9 g/dL (ref 30.0–36.0)
MCV: 94.6 fL (ref 78.0–100.0)
Monocytes Absolute: 0.7 10*3/uL (ref 0.1–1.0)
Monocytes Relative: 5.4 % (ref 3.0–12.0)
Neutro Abs: 10.2 10*3/uL — ABNORMAL HIGH (ref 1.4–7.7)
Neutrophils Relative %: 81.9 % — ABNORMAL HIGH (ref 43.0–77.0)
Platelets: 209 10*3/uL (ref 150.0–400.0)
RBC: 4.53 Mil/uL (ref 3.87–5.11)
RDW: 13 % (ref 11.5–15.5)
WBC: 12.4 10*3/uL — ABNORMAL HIGH (ref 4.0–10.5)

## 2023-04-27 LAB — COMPREHENSIVE METABOLIC PANEL
ALT: 17 U/L (ref 0–35)
AST: 16 U/L (ref 0–37)
Albumin: 4.4 g/dL (ref 3.5–5.2)
Alkaline Phosphatase: 76 U/L (ref 39–117)
BUN: 13 mg/dL (ref 6–23)
CO2: 27 meq/L (ref 19–32)
Calcium: 10.2 mg/dL (ref 8.4–10.5)
Chloride: 101 meq/L (ref 96–112)
Creatinine, Ser: 0.71 mg/dL (ref 0.40–1.20)
GFR: 81.27 mL/min (ref >60.00)
Glucose, Bld: 187 mg/dL — ABNORMAL HIGH (ref 70–99)
Potassium: 4.1 meq/L (ref 3.5–5.1)
Sodium: 137 meq/L (ref 135–145)
Total Bilirubin: 0.6 mg/dL (ref 0.2–1.2)
Total Protein: 7.7 g/dL (ref 6.0–8.3)

## 2023-04-27 LAB — LIPID PANEL
Cholesterol: 150 mg/dL (ref 0–200)
HDL: 54.7 mg/dL (ref >39.00)
LDL Cholesterol: 71 mg/dL (ref 0–99)
NonHDL: 94.83
Total CHOL/HDL Ratio: 3
Triglycerides: 120 mg/dL (ref 0.0–149.0)
VLDL: 24 mg/dL (ref 0.0–40.0)

## 2023-04-27 LAB — VITAMIN D 25 HYDROXY (VIT D DEFICIENCY, FRACTURES): VITD: 37.66 ng/mL (ref 30.00–100.00)

## 2023-04-27 LAB — HEMOGLOBIN A1C: Hgb A1c MFr Bld: 8 % — ABNORMAL HIGH (ref 4.6–6.5)

## 2023-04-27 LAB — TSH: TSH: 3.17 u[IU]/mL (ref 0.35–5.50)

## 2023-04-27 MED ORDER — LISINOPRIL 10 MG PO TABS: 10.0000 mg | ORAL_TABLET | Freq: Every day | ORAL | 0 refills | Status: DC

## 2023-04-27 MED ORDER — DONEPEZIL HCL 5 MG PO TABS: 5.0000 mg | ORAL_TABLET | Freq: Every day | ORAL | 0 refills | Status: DC

## 2023-04-27 NOTE — Addendum Note (Signed)
Addended by: Nancy Fetter on: 04/27/2023 04:03 PM   Modules accepted: Orders

## 2023-04-27 NOTE — Progress Notes (Signed)
Subjective:    Patient ID: Susan Schmidt, female    DOB: [birth year], 77 y.o.   MRN: [id number]  HPI Patient presents for yearly preventative medicine examination. She is a pleasant 77 year old female who  has a past medical history of DM (diabetes mellitus), type 2, uncontrolled, Hyperlipidemia, and Osteoporosis.  She is with her sister Gays Lions)  today who helps provide history   DM Type 2.  She is currently diet controlled.  She does monitor her blood sugars at home with readings, lately her BS have been elevated into 160-200.  She has not been exercising and mostly sits in a chair all day watching TV. Lab Results  Component Value Date   HGBA1C 6.8 (A) 10/19/2022   HGBA1C 6.0 (A) 04/19/2022   HGBA1C 5.8 (A) 10/13/2021    Hypertension - has been controlled without medication in the past. She has not been checking her blood pressure at home  BP Readings from Last 3 Encounters:  04/27/23 (!) 160/70  10/19/22 (!) 140/88  07/13/22 128/74    Vitamin D deficiency-takes 2000 units of vitamin D daily  Hyperlipidemia-managed with Crestor 5 mg daily.  She denies myalgia or fatigue Lab Results  Component Value Date   CHOL 172 04/19/2022   HDL 71.80 04/19/2022   LDLCALC 76 04/19/2022   TRIG 121.0 04/19/2022   CHOLHDL 2 04/19/2022    Osteoporosis-last DEXA scan was in April 2024 with a T score of -3.3.  She is currently taking vitamin D and calcium supplements with Fosamax 70 mg weekly.  Cognitive Impairment -this is her biggest concern today.  Patient's sister reports that over the last 3 to 4 months she has had a rapid decline in her memory.  Her daughter reports that the patient will pick up a phone and say hello before dialing the number has noticed confusion and word finding issues as well as has a hard time remembering on how to put on her close.  She is no longer driving and she does not cook for herself on a regular basis.  There is a family history of dementia with her  mother at end-of-life.   All immunizations and health maintenance protocols were reviewed with the patient and needed orders were placed.  Appropriate screening laboratory values were ordered for the patient including screening of hyperlipidemia, renal function and hepatic function.  Medication reconciliation,  past medical history, social history, problem list and allergies were reviewed in detail with the patient  Goals were established with regard to weight loss, exercise, and  diet in compliance with medications Wt Readings from Last 3 Encounters:  04/27/23 146 lb (66.2 kg)  10/19/22 134 lb (60.8 kg)  07/13/22 120 lb (54.4 kg)   Review of Systems  Constitutional: Negative.   HENT: Negative.    Eyes: Negative.   Respiratory: Negative.    Cardiovascular: Negative.   Gastrointestinal: Negative.   Endocrine: Negative.   Genitourinary: Negative.   Musculoskeletal: Negative.   Skin: Negative.   Allergic/Immunologic: Negative.   Neurological: Negative.   Hematological: Negative.   Psychiatric/Behavioral:  Positive for confusion.    Past Medical History:  Diagnosis Date   DM (diabetes mellitus), type 2, uncontrolled    Hyperlipidemia    Osteoporosis     Social History   Socioeconomic History   Marital status: Single    Spouse name: Not on file   Number of children: Not on file   Years of education: Not on file   Highest  education level: Not on file  Occupational History   Not on file  Tobacco Use   Smoking status: Former    Current packs/day: 0.00    Average packs/day: 2.0 packs/day for 12.0 years (24.0 ttl pk-yrs)    Types: Cigarettes    Start date: 07/11/1964    Quit date: 07/11/1976    Years since quitting: 46.8   Smokeless tobacco: Never  Vaping Use   Vaping status: Never Used  Substance and Sexual Activity   Alcohol use: No   Drug use: Never   Sexual activity: Not Currently  Other Topics Concern   Not on file  Social History Narrative   Retired    Not  married    No kids          Social Determinants of Health   Financial Resource Strain: Not on file  Food Insecurity: Not on file  Transportation Needs: Not on file  Physical Activity: Not on file  Stress: Not on file  Social Connections: Not on file  Intimate Partner Violence: Not on file    Past Surgical History:  Procedure Laterality Date   ANKLE SURGERY Right    COLONOSCOPY N/A 07/03/2019   Procedure: COLONOSCOPY;  Surgeon: Malissa Hippo, MD;  [facility]: [facility];  Service: Endoscopy;  Laterality: N/A;  830   INTRAMEDULLARY (IM) NAIL INTERTROCHANTERIC Left 03/27/2020   Procedure: INTRAMEDULLARY (IM) NAIL INTERTROCHANTRIC;  Surgeon: Vickki Hearing, MD;  Location: [facility];  Service: Orthopedics;  Laterality: Left;   pilonidal cyst      Family History  Problem Relation Age of Onset   Diabetes Mother    Hypertension Mother    Hypertension Father    Heart attack Father    Hypertension Sister    Diabetes Brother    Diabetes Sister     Allergies  Allergen Reactions   Strawberry Extract Swelling    Current Outpatient Medications on File Prior to Visit  Medication Sig Dispense Refill   acetaminophen (TYLENOL) 325 MG tablet Take 325 mg by mouth as needed.     alendronate (FOSAMAX) 70 MG tablet TAKE 1 TABLET EVERY WEEK IN THE MORNING 30 MIN BEFORE EATING WITH AN 8OZ GLASS OF WATER (SIT UP 30 MIN) 12 tablet 3   aspirin EC 81 MG tablet Take 81 mg by mouth daily. Swallow whole.     Biotin 16109 MCG TABS Take 1 tablet daily by mouth.     Blood Glucose Monitoring Suppl (ONE TOUCH ULTRA 2) w/Device KIT Test blood sugar twice daily 1 kit 0   Calcium Carbonate-Vit D-Min (CALCIUM-VITAMIN D-MINERALS) 600-800 MG-UNIT CHEW Chew 1 tablet daily by mouth.     Cholecalciferol (VITAMIN D) 50 MCG (2000 UT) tablet Take 2,000 Units by mouth daily.     Ferrous Sulfate (IRON) 325 (65 Fe) MG TABS Take by mouth.     GLUCOSAMINE-CHONDROITIN PO Take 1 tablet by mouth daily. 1500 mg  glucosamine and 1200 mg of choindroitan     Lancets (ONETOUCH DELICA PLUS LANCET33G) MISC USE TO TEST BLOOD SUGAR TWICE DAILY AS DIRECTED. 100 each 0   meloxicam (MOBIC) 7.5 MG tablet Take 1 tablet (7.5 mg total) by mouth daily. 30 tablet 5   Multiple Vitamins-Minerals (MULTIVITAMIN WOMEN 50+) TABS Take 1 tablet daily by mouth.     Omega-3 Fatty Acids (SUPER OMEGA 3 EPA/DHA PO) Take 660 mg by mouth daily.     ONETOUCH ULTRA test strip USE TO TEST BLOOD SUGAR TWICE DAILY AS DIRECTED. 100 strip  0   rosuvastatin (CRESTOR) 5 MG tablet TAKE 1 TABLET BY MOUTH ONCE DAILY. 90 tablet 0   tiZANidine (ZANAFLEX) 4 MG tablet Take 1 tablet (4 mg total) by mouth daily. 30 tablet 1   vitamin C (ASCORBIC ACID) 500 MG tablet Take 500 mg by mouth daily.     vitamin E 180 MG (400 UNITS) capsule Take 400 Units by mouth daily.     No current facility-administered medications on file prior to visit.    BP (!) 160/70   Pulse 83   Temp 98.3 F (36.8 C) (Oral)   Ht 5\' 1"  (1.549 m)   Wt 146 lb (66.2 kg)   SpO2 98%   BMI 27.59 kg/m       Objective:   Physical Exam Vitals and nursing note reviewed.  Constitutional:      General: She is not in acute distress.    Appearance: Normal appearance. She is not ill-appearing.  HENT:     Head: Normocephalic and atraumatic.     Right Ear: Tympanic membrane, ear canal and external ear normal. There is no impacted cerumen.     Left Ear: Tympanic membrane, ear canal and external ear normal. There is no impacted cerumen.     Nose: Nose normal. No congestion or rhinorrhea.     Mouth/Throat:     Mouth: Mucous membranes are moist.     Pharynx: Oropharynx is clear.  Eyes:     Extraocular Movements: Extraocular movements intact.     Conjunctiva/sclera: Conjunctivae normal.     Pupils: Pupils are equal, round, and reactive to light.  Neck:     Vascular: No carotid bruit.  Cardiovascular:     Rate and Rhythm: Normal rate and regular rhythm.     Pulses: Normal pulses.      Heart sounds: No murmur heard.    No friction rub. No gallop.  Pulmonary:     Effort: Pulmonary effort is normal.     Breath sounds: Normal breath sounds.  Abdominal:     General: Abdomen is flat. Bowel sounds are normal. There is no distension.     Palpations: Abdomen is soft. There is no mass.     Tenderness: There is no abdominal tenderness. There is no guarding or rebound.     Hernia: No hernia is present.  Musculoskeletal:        General: Normal range of motion.     Cervical back: Normal range of motion and neck supple.     Right lower leg: 1+ Pitting Edema present.     Left lower leg: Pitting Edema present.  Lymphadenopathy:     Cervical: No cervical adenopathy.  Skin:    General: Skin is warm and dry.     Capillary Refill: Capillary refill takes less than 2 seconds.  Neurological:     General: No focal deficit present.     Mental Status: She is alert and oriented to person, place, and time.     Cranial Nerves: No cranial nerve deficit or facial asymmetry.     Sensory: Sensation is intact.     Motor: Weakness present.     Coordination: Coordination abnormal.     Gait: Gait abnormal (shuffling gait with walker) and tandem walk abnormal.     Comments: 5/5 grip strength bilaterally 5/5 strength in upper extremities with pulling and pushing 3/5 strength in right lower extremity with pulling an pushing  4/5 strength in left lower extremity with pushing and pulling  Psychiatric:  Mood and Affect: Mood and affect normal.        Behavior: Behavior is slowed.        Thought Content: Thought content normal.        Cognition and Memory: Cognition is impaired. Memory is impaired. She exhibits impaired recent memory and impaired remote memory.     Comments: Word finding difficulty noted. She had a hard time completing sentences and often appeared to be confused       Assessment & Plan:  1. Routine general medical examination at a health care facility Today patient  counseled on age appropriate routine health concerns for screening and prevention, each reviewed and up to date or declined. Immunizations reviewed and up to date or declined. Labs ordered and reviewed. Risk factors for depression reviewed and negative. Hearing function and visual acuity are intact. ADLs screened and addressed as needed. Functional ability and level of safety reviewed and appropriate. Education, counseling and referrals performed based on assessed risks today. Patient provided with a copy of personalized plan for preventive services.   2. Controlled type 2 diabetes mellitus with complication, without long-term current use of insulin (HCC) - Likely need to start her on medication  - CBC with Differential/Platelet; Future - Comprehensive metabolic panel; Future - Lipid panel; Future - TSH; Future - Microalbumin/Creatinine Ratio, Urine; Future - Hemoglobin A1c; Future  3. Essential hypertension - Will start on lisinopril 10 mg and have her follow up in 1 month  - CBC with Differential/Platelet; Future - Comprehensive metabolic panel; Future - Lipid panel; Future - TSH; Future - lisinopril (ZESTRIL) 10 MG tablet; Take 1 tablet (10 mg total) by mouth daily.  Dispense: 30 tablet; Refill: 0  4. Age-related osteoporosis without current pathological fracture - Continue with Fosamax  - CBC with Differential/Platelet; Future - Comprehensive metabolic panel; Future - Lipid panel; Future - TSH; Future - VITAMIN D 25 Hydroxy (Vit-D Deficiency, Fractures); Future  5. Mixed hyperlipidemia  - CBC with Differential/Platelet; Future - Comprehensive metabolic panel; Future - Lipid panel; Future - TSH; Future - Microalbumin/Creatinine Ratio, Urine; Future  6. Cognitive decline -She is a completely different person than when I saw her last.  She has significant cognitive decline, appeared confused or lost in her self throughout the entire exam.  Would often make up words if she could  not think of the word that she was trying to say.  She likely has some form of dementia.  To get a better diagnosis will refer to neuropsychiatry.  Emina start her on Aricept 5 mg today and will follow-up with her in a month. - CBC with Differential/Platelet; Future - Comprehensive metabolic panel; Future - Lipid panel; Future - TSH; Future - Microalbumin/Creatinine Ratio, Urine; Future - VITAMIN D 25 Hydroxy (Vit-D Deficiency, Fractures); Future - Hemoglobin A1c; Future - Ambulatory referral to Neuropsychology - MR Brain Wo Contrast; Future - donepezil (ARICEPT) 5 MG tablet; Take 1 tablet (5 mg total) by mouth at bedtime.  Dispense: 90 tablet; Refill: 0  7. Need for influenza vaccination  - Flu Vaccine Trivalent High Dose (Fluad)  Time spent with patient today was 45 minutes which consisted of chart review, discussing chronic medical conditions and new cognitive impairment , work up, treatment answering questions and documentation.

## 2023-04-27 NOTE — Patient Instructions (Signed)
It was great seeing you today   We will follow up with you regarding your lab work   Please let me know if you need anything   I am going to start you on a blood pressure medication called lisinopril   I am also going to start you on a medication called Aricept to help with memory  I have ordered an MRI of the head and referred you to Neuropsych for evaluation   Please follow up with me in one month

## 2023-04-28 ENCOUNTER — Telehealth: Payer: Self-pay | Admitting: Adult Health

## 2023-04-28 ENCOUNTER — Other Ambulatory Visit: Payer: Self-pay | Admitting: Adult Health

## 2023-04-28 MED ORDER — GLIPIZIDE ER 2.5 MG PO TB24: 2.5000 mg | ORAL_TABLET | Freq: Every day | ORAL | 1 refills | Status: DC

## 2023-04-28 NOTE — Telephone Encounter (Signed)
Updated patients sister on lab results. Her A1c is at 8.0 - will start on glipizide 2.5 mg ER   Her sister is going to bring her back to the office never week to leave a urine sample

## 2023-05-02 ENCOUNTER — Other Ambulatory Visit (INDEPENDENT_AMBULATORY_CARE_PROVIDER_SITE_OTHER): Payer: Medicare Other

## 2023-05-02 DIAGNOSIS — R4189 Other symptoms and signs involving cognitive functions and awareness: Secondary | ICD-10-CM | POA: Diagnosis not present

## 2023-05-02 DIAGNOSIS — R300 Dysuria: Secondary | ICD-10-CM | POA: Diagnosis not present

## 2023-05-02 DIAGNOSIS — R410 Disorientation, unspecified: Secondary | ICD-10-CM | POA: Diagnosis not present

## 2023-05-02 LAB — URINALYSIS, ROUTINE W REFLEX MICROSCOPIC
Bilirubin Urine: NEGATIVE
Hgb urine dipstick: NEGATIVE
Ketones, ur: NEGATIVE
Nitrite: POSITIVE — AB
Specific Gravity, Urine: 1.02 (ref 1.000–1.030)
Total Protein, Urine: NEGATIVE
Urine Glucose: NEGATIVE
Urobilinogen, UA: 0.2 (ref 0.0–1.0)
pH: 6 (ref 5.0–8.0)

## 2023-05-04 ENCOUNTER — Telehealth: Payer: Self-pay | Admitting: Adult Health

## 2023-05-04 LAB — URINE CULTURE
MICRO NUMBER:: 15626787
SPECIMEN QUALITY:: ADEQUATE

## 2023-05-04 MED ORDER — AMOXICILLIN-POT CLAVULANATE 875-125 MG PO TABS: 1.0000 | ORAL_TABLET | Freq: Two times a day (BID) | ORAL | 0 refills | Status: AC

## 2023-05-04 NOTE — Telephone Encounter (Signed)
Updated patients sister Lipscomb Lions) about labs from the urinalysis. She does have a UTI. I am wondering how much of the current confusion is coming from a UTI vs dementia.   Will treat with Augmentin

## 2023-05-06 ENCOUNTER — Ambulatory Visit (HOSPITAL_COMMUNITY)
Admission: RE | Admit: 2023-05-06 | Discharge: 2023-05-06 | Disposition: A | Discharge status: Home / Self Care | Payer: Medicare Other | Source: Ambulatory Visit | Attending: Adult Health | Admitting: Adult Health

## 2023-05-06 DIAGNOSIS — I6782 Cerebral ischemia: Secondary | ICD-10-CM | POA: Diagnosis not present

## 2023-05-06 DIAGNOSIS — R4189 Other symptoms and signs involving cognitive functions and awareness: Secondary | ICD-10-CM | POA: Diagnosis present

## 2023-05-06 DIAGNOSIS — G319 Degenerative disease of nervous system, unspecified: Secondary | ICD-10-CM | POA: Diagnosis not present

## 2023-05-25 ENCOUNTER — Other Ambulatory Visit: Payer: Self-pay | Admitting: Adult Health

## 2023-05-25 DIAGNOSIS — I10 Essential (primary) hypertension: Secondary | ICD-10-CM

## 2023-05-30 ENCOUNTER — Ambulatory Visit (INDEPENDENT_AMBULATORY_CARE_PROVIDER_SITE_OTHER): Payer: Medicare Other | Admitting: Adult Health

## 2023-05-30 ENCOUNTER — Encounter: Payer: Self-pay | Admitting: Adult Health

## 2023-05-30 VITALS — BP 170/80 | HR 80 | Temp 98.3°F | Ht 61.0 in | Wt 147.0 lb

## 2023-05-30 DIAGNOSIS — I10 Essential (primary) hypertension: Secondary | ICD-10-CM

## 2023-05-30 DIAGNOSIS — R4189 Other symptoms and signs involving cognitive functions and awareness: Secondary | ICD-10-CM | POA: Diagnosis not present

## 2023-05-30 MED ORDER — BLOOD GLUCOSE MONITOR KIT: PACK | 0 refills | Status: AC

## 2023-05-30 MED ORDER — LISINOPRIL 40 MG PO TABS: 40.0000 mg | ORAL_TABLET | Freq: Every day | ORAL | 0 refills | Status: DC

## 2023-05-30 NOTE — Progress Notes (Signed)
Subjective:    Patient ID: Susan Schmidt, female    DOB: Dec 23, 1945, 77 y.o.   MRN: [id number]  HPI  77 year old female who  has a past medical history of DM (diabetes mellitus), type 2, uncontrolled, Hyperlipidemia, and Osteoporosis.  She presents to the office today for 1 month follow-up regarding cognitive impairment and hypertension. Her daughter is with her today.   Cognitive impairment  -  When she was seen a month ago her sister was with her to help provide history.  Her sister reports that over the last 3 to 4 months she has had a rapid decline in her memory.  Her sister had noticed confusion, word finding issues and patient was having a hard time remembering how to do her ADLs.  There is a family history of dementia on her mother side.  Her MMSE score was 3.  She was found to have a urinary tract infection and this was treated but unsure of how much her cognitive decline was due to urinary tract infection versus dementia and she was started on Aricept 5 mg. MRI of the brain was ordered.   Today, her sister reports that since treating her urinary tract infection there has been a slight improvement in her cognitive function.  Her daughter reports that she seems to be able to talk on the phone a little bit easier but overall is still severely confused. Her MRI was  completed but I do not have a read on this yet .   HTN -past she was controlled without medication but it was noticed that her blood pressure was elevated into the 160s over 70s during her last visit.  Started on lisinopril 10 mg.  Has been no noticeable side effects since starting lisinopril.  There has been no blood pressure checks at home.  Review of Systems See HPI   Past Medical History:  Diagnosis Date   DM (diabetes mellitus), type 2, uncontrolled    Hyperlipidemia    Osteoporosis     Social History   Socioeconomic History   Marital status: Single    Spouse name: Not on file   Number of children:  Not on file   Years of education: Not on file   Highest education level: Not on file  Occupational History   Not on file  Tobacco Use   Smoking status: Former    Current packs/day: 0.00    Average packs/day: 2.0 packs/day for 12.0 years (24.0 ttl pk-yrs)    Types: Cigarettes    Start date: 07/11/1964    Quit date: 07/11/1976    Years since quitting: 46.9   Smokeless tobacco: Never  Vaping Use   Vaping status: Never Used  Substance and Sexual Activity   Alcohol use: No   Drug use: Never   Sexual activity: Not Currently  Other Topics Concern   Not on file  Social History Narrative   Retired    Not married    No kids          Social Determinants of Health   Financial Resource Strain: Not on file  Food Insecurity: Not on file  Transportation Needs: Not on file  Physical Activity: Not on file  Stress: Not on file  Social Connections: Not on file  Intimate Partner Violence: Not on file    Past Surgical History:  Procedure Laterality Date   ANKLE SURGERY Right    COLONOSCOPY N/A 07/03/2019   Procedure: COLONOSCOPY;  Surgeon: Malissa Hippo, MD;  [facility];  Service: Endoscopy;  Laterality: N/A;  830   INTRAMEDULLARY (IM) NAIL INTERTROCHANTERIC Left 03/27/2020   Procedure: INTRAMEDULLARY (IM) NAIL INTERTROCHANTRIC;  Surgeon: Vickki Hearing, MD;  Location: [facility];  Service: Orthopedics;  Laterality: Left;   pilonidal cyst      Family History  Problem Relation Age of Onset   Diabetes Mother    Hypertension Mother    Hypertension Father    Heart attack Father    Hypertension Sister    Diabetes Brother    Diabetes Sister     Allergies  Allergen Reactions   Strawberry Extract Swelling    Current Outpatient Medications on File Prior to Visit  Medication Sig Dispense Refill   acetaminophen (TYLENOL) 325 MG tablet Take 325 mg by mouth as needed.     alendronate (FOSAMAX) 70 MG tablet TAKE 1 TABLET EVERY WEEK IN THE MORNING 30 MIN BEFORE EATING WITH  AN 8OZ GLASS OF WATER (SIT UP 30 MIN) 12 tablet 3   aspirin EC 81 MG tablet Take 81 mg by mouth daily. Swallow whole.     Biotin 16109 MCG TABS Take 1 tablet daily by mouth.     Blood Glucose Monitoring Suppl (ONE TOUCH ULTRA 2) w/Device KIT Test blood sugar twice daily 1 kit 0   Calcium Carbonate-Vit D-Min (CALCIUM-VITAMIN D-MINERALS) 600-800 MG-UNIT CHEW Chew 1 tablet daily by mouth.     Cholecalciferol (VITAMIN D) 50 MCG (2000 UT) tablet Take 2,000 Units by mouth daily.     donepezil (ARICEPT) 5 MG tablet Take 1 tablet (5 mg total) by mouth at bedtime. 90 tablet 0   Ferrous Sulfate (IRON) 325 (65 Fe) MG TABS Take by mouth.     glipiZIDE (GLUCOTROL XL) 2.5 MG 24 hr tablet Take 1 tablet (2.5 mg total) by mouth daily with breakfast. 90 tablet 1   GLUCOSAMINE-CHONDROITIN PO Take 1 tablet by mouth daily. 1500 mg glucosamine and 1200 mg of choindroitan     Lancets (ONETOUCH DELICA PLUS LANCET33G) MISC USE TO TEST BLOOD SUGAR TWICE DAILY AS DIRECTED. 100 each 0   Multiple Vitamins-Minerals (MULTIVITAMIN WOMEN 50+) TABS Take 1 tablet daily by mouth.     Omega-3 Fatty Acids (SUPER OMEGA 3 EPA/DHA PO) Take 660 mg by mouth daily.     ONETOUCH ULTRA test strip USE TO TEST BLOOD SUGAR TWICE DAILY AS DIRECTED. 100 strip 0   rosuvastatin (CRESTOR) 5 MG tablet TAKE 1 TABLET BY MOUTH ONCE DAILY. 90 tablet 0   vitamin C (ASCORBIC ACID) 500 MG tablet Take 500 mg by mouth daily.     vitamin E 180 MG (400 UNITS) capsule Take 400 Units by mouth daily.     No current facility-administered medications on file prior to visit.    BP (!) 170/80   Pulse 80   Temp 98.3 F (36.8 C) (Oral)   Ht 5\' 1"  (1.549 m)   Wt 147 lb (66.7 kg)   SpO2 92%   BMI 27.78 kg/m       Objective:   Physical Exam Vitals and nursing note reviewed.  Constitutional:      Appearance: Normal appearance.  Cardiovascular:     Rate and Rhythm: Normal rate and regular rhythm.     Pulses: Normal pulses.     Heart sounds: Normal heart  sounds.  Pulmonary:     Effort: Pulmonary effort is normal.     Breath sounds: Normal breath sounds.  Skin:    General: Skin is warm  and dry.  Neurological:     Mental Status: She is alert.  Psychiatric:        Mood and Affect: Mood normal.        Behavior: Behavior normal.        Thought Content: Thought content normal.        Cognition and Memory: Cognition is impaired. Memory is impaired. She exhibits impaired recent memory and impaired remote memory.        Judgment: Judgment normal.       Assessment & Plan:  1. Cognitive impairment    05/30/2023   12:02 PM 04/27/2023    9:53 AM  MMSE - Mini Mental State Exam  Orientation to time 0 0  Orientation to Place 0 0  Registration 1 1  Attention/ Calculation 0 0  Recall 0 0  Language- name 2 objects 1 1  Language- repeat 1 1  Language- follow 3 step command 0 0  Language- read & follow direction 0 0  Write a sentence 0 0  Copy design 0 0  Total score 3 3  - No change in MMSE.  - Will keep her on Aricept 5 mg for the next month - Ambulatory referral to Neurology  2. Primary hypertension - BP did not change - Will increase Lisinopril to 40 mg  - Follow up in 30 days or sooner if needed - lisinopril (ZESTRIL) 40 MG tablet; Take 1 tablet (40 mg total) by mouth daily.  Dispense: 30 tablet; Refill: 0  Shirline Frees, NP

## 2023-05-30 NOTE — Patient Instructions (Signed)
It was great seeing you today   I am going to refer you to a neurologist  I have also increased your lisinopril to 40 mg daily

## 2023-06-28 ENCOUNTER — Telehealth: Payer: Self-pay

## 2023-06-28 NOTE — Telephone Encounter (Signed)
Copied from CRM [id number]. Topic: General - Other >> Jun 28, 2023  3:25 PM Irine Seal wrote: Reason for CRM: PT sister and caretaker Sallye Ober would like a callback, she canceled PT appt, but would like to speak with nurse or provider to see if rescheduling can wait until after the holidays     Callback: sister Sallye Ober [phone]

## 2023-06-28 NOTE — Telephone Encounter (Signed)
Tried to call provided number back but no answer. However, she is not on the DPR so I cannot discuss pt information with her.

## 2023-06-29 ENCOUNTER — Ambulatory Visit: Payer: Medicare Other | Admitting: Adult Health

## 2023-07-06 ENCOUNTER — Other Ambulatory Visit: Payer: Self-pay | Admitting: Adult Health

## 2023-07-06 ENCOUNTER — Telehealth: Payer: Self-pay

## 2023-07-06 DIAGNOSIS — I10 Essential (primary) hypertension: Secondary | ICD-10-CM

## 2023-07-06 MED ORDER — LISINOPRIL 40 MG PO TABS: 40.0000 mg | ORAL_TABLET | Freq: Every day | ORAL | 3 refills | Status: DC

## 2023-07-06 NOTE — Telephone Encounter (Signed)
Copied from CRM [id number]. Topic: Clinical - Medication Refill >> Jul 06, 2023  2:04 PM Elizebeth Brooking wrote: Most Recent Primary Care Visit:  Provider: Shirline Frees  Department: [facility]  Visit Type: OFFICE VISIT  Date: 05/30/2023  Medication:  lisinopril (ZESTRIL) 40 MG tablet  rosuvastatin (CRESTOR) 5 MG tablet  Has the patient contacted their pharmacy? Yes (Agent: If no, request that the patient contact the pharmacy for the refill. If patient does not wish to contact the pharmacy document the reason why and proceed with request.) (Agent: If yes, when and what did the pharmacy advise?)  Is this the correct pharmacy for this prescription? Yes If no, delete pharmacy and type the correct one.  This is the patient's preferred pharmacy:  [facility] - [place], [place] - [address] [place] [address] Phone: [phone] Fax: [phone]   Has the prescription been filled recently? No  Is the patient out of the medication? Yes  Has the patient been seen for an appointment in the last year OR does the patient have an upcoming appointment? Yes  Can we respond through MyChart? Yes  Agent: Please be advised that Rx refills may take up to 3 business days. We ask that you follow-up with your pharmacy.

## 2023-07-06 NOTE — Telephone Encounter (Signed)
Rx sent to pharmacy   

## 2023-07-07 ENCOUNTER — Telehealth: Payer: Self-pay | Admitting: Adult Health

## 2023-07-07 MED ORDER — ROSUVASTATIN CALCIUM 5 MG PO TABS: 5.0000 mg | ORAL_TABLET | Freq: Every day | ORAL | 0 refills | Status: DC

## 2023-07-07 NOTE — Telephone Encounter (Signed)
Prescription sent to pharmacy. No other action needed.  

## 2023-07-07 NOTE — Telephone Encounter (Signed)
Call center called to F/U on this medication refill rosuvastatin (CRESTOR) 5 MG tablet  Pt already received the Lisinopril.

## 2023-07-20 ENCOUNTER — Encounter: Payer: Self-pay | Admitting: Adult Health

## 2023-07-20 ENCOUNTER — Ambulatory Visit (INDEPENDENT_AMBULATORY_CARE_PROVIDER_SITE_OTHER): Payer: Medicare Other | Admitting: Adult Health

## 2023-07-20 VITALS — BP 138/86 | HR 87 | Temp 98.2°F | Ht 61.0 in | Wt 161.0 lb

## 2023-07-20 DIAGNOSIS — I10 Essential (primary) hypertension: Secondary | ICD-10-CM | POA: Diagnosis not present

## 2023-07-20 DIAGNOSIS — Z7984 Long term (current) use of oral hypoglycemic drugs: Secondary | ICD-10-CM

## 2023-07-20 DIAGNOSIS — E118 Type 2 diabetes mellitus with unspecified complications: Secondary | ICD-10-CM

## 2023-07-20 LAB — POCT GLYCOSYLATED HEMOGLOBIN (HGB A1C): Hemoglobin A1C: 7.9 % — AB (ref 4.0–5.6)

## 2023-07-20 MED ORDER — GLIPIZIDE ER 5 MG PO TB24: 5.0000 mg | ORAL_TABLET | Freq: Every day | ORAL | 1 refills | Status: DC

## 2023-07-20 NOTE — Patient Instructions (Signed)
 Health Maintenance Due  Topic Date Due   Medicare Annual Wellness (AWV)  Never done   DTaP/Tdap/Td (1 - Tdap) Never done   OPHTHALMOLOGY EXAM  10/26/2022   COVID-19 Vaccine (4 - 2024-25 season) 03/12/2023   Diabetic kidney evaluation - Urine ACR  04/20/2023   FOOT EXAM  04/20/2023       04/27/2023    9:45 AM 04/19/2022    9:04 AM 04/16/2021   10:33 AM  Depression screen PHQ 2/9  Decreased Interest 0 1 0  Down, Depressed, Hopeless 0 0 0  PHQ - 2 Score 0 1 0

## 2023-07-20 NOTE — Progress Notes (Signed)
 Subjective:    Patient ID: Susan Schmidt, female    DOB: [birth year], 78 y.o.   MRN: [id number]  HPI 78 year old female who  has a past medical history of DM (diabetes mellitus), type 2, uncontrolled, Hyperlipidemia, and Osteoporosis.  She presents to the office today for follow up regarding DM/HTN. Her sister Roselind is with her today.   DM Type 2 - currently managed with glipizide  2.5 mg ER daily. She has not been checking her blood sugars at home.  Lab Results  Component Value Date   HGBA1C 8.0 (H) 04/27/2023   HGBA1C 6.8 (A) 10/19/2022   HGBA1C 6.0 (A) 04/19/2022   HTN - Stared on lisinopril  40 mg in November 2024. She has not had any dizziness, lightheadedness, or blurred vision.  BP Readings from Last 3 Encounters:  07/20/23 138/86  05/30/23 (!) 170/80  04/27/23 (!) 160/70   Review of Systems See HPI   Past Medical History:  Diagnosis Date   DM (diabetes mellitus), type 2, uncontrolled    Hyperlipidemia    Osteoporosis     Social History   Socioeconomic History   Marital status: Single    Spouse name: Not on file   Number of children: Not on file   Years of education: Not on file   Highest education level: Not on file  Occupational History   Not on file  Tobacco Use   Smoking status: Former    Current packs/day: 0.00    Average packs/day: 2.0 packs/day for 12.0 years (24.0 ttl pk-yrs)    Types: Cigarettes    Start date: 07/11/1964    Quit date: 07/11/1976    Years since quitting: 47.0   Smokeless tobacco: Never  Vaping Use   Vaping status: Never Used  Substance and Sexual Activity   Alcohol use: No   Drug use: Never   Sexual activity: Not Currently  Other Topics Concern   Not on file  Social History Narrative   Retired    Not married    No kids          Social Drivers of Corporate Investment Banker Strain: Not on file  Food Insecurity: Not on file  Transportation Needs: Not on file  Physical Activity: Not on file  Stress: Not on file   Social Connections: Not on file  Intimate Partner Violence: Not on file    Past Surgical History:  Procedure Laterality Date   ANKLE SURGERY Right    COLONOSCOPY N/A 07/03/2019   Procedure: COLONOSCOPY;  Surgeon: Golda Claudis PENNER, MD;  [facility]: [facility];  Service: Endoscopy;  Laterality: N/A;  830   INTRAMEDULLARY (IM) NAIL INTERTROCHANTERIC Left 03/27/2020   Procedure: INTRAMEDULLARY (IM) NAIL INTERTROCHANTRIC;  Surgeon: Margrette Taft BRAVO, MD;  Location: [facility];  Service: Orthopedics;  Laterality: Left;   pilonidal cyst      Family History  Problem Relation Age of Onset   Diabetes Mother    Hypertension Mother    Hypertension Father    Heart attack Father    Hypertension Sister    Diabetes Brother    Diabetes Sister     Allergies  Allergen Reactions   Strawberry Extract Swelling    Current Outpatient Medications on File Prior to Visit  Medication Sig Dispense Refill   acetaminophen  (TYLENOL ) 325 MG tablet Take 325 mg by mouth as needed.     alendronate  (FOSAMAX ) 70 MG tablet TAKE 1 TABLET EVERY WEEK IN THE MORNING 30 MIN BEFORE EATING  WITH AN 8OZ GLASS OF WATER  (SIT UP 30 MIN) 12 tablet 3   aspirin  EC 81 MG tablet Take 81 mg by mouth daily. Swallow whole.     Biotin 89999 MCG TABS Take 1 tablet daily by mouth.     blood glucose meter kit and supplies KIT Dispense based on patient and insurance preference. Use up to four times daily as directed. 1 each 0   Blood Glucose Monitoring Suppl (ONE TOUCH ULTRA 2) w/Device KIT Test blood sugar twice daily 1 kit 0   Calcium  Carbonate-Vit D-Min (CALCIUM -VITAMIN D -MINERALS) 600-800 MG-UNIT CHEW Chew 1 tablet daily by mouth.     Cholecalciferol (VITAMIN D ) 50 MCG (2000 UT) tablet Take 2,000 Units by mouth daily.     Ferrous Sulfate (IRON ) 325 (65 Fe) MG TABS Take by mouth.     glipiZIDE  (GLUCOTROL  XL) 2.5 MG 24 hr tablet Take 1 tablet (2.5 mg total) by mouth daily with breakfast. 90 tablet 1   GLUCOSAMINE-CHONDROITIN PO Take 1  tablet by mouth daily. 1500 mg glucosamine and 1200 mg of choindroitan     Lancets (ONETOUCH DELICA PLUS LANCET33G) MISC USE TO TEST BLOOD SUGAR TWICE DAILY AS DIRECTED. 100 each 0   lisinopril  (ZESTRIL ) 40 MG tablet Take 1 tablet (40 mg total) by mouth daily. 90 tablet 3   Multiple Vitamins-Minerals (MULTIVITAMIN WOMEN 50+) TABS Take 1 tablet daily by mouth.     Omega-3 Fatty Acids (SUPER OMEGA 3 EPA/DHA PO) Take 660 mg by mouth daily.     ONETOUCH ULTRA test strip USE TO TEST BLOOD SUGAR TWICE DAILY AS DIRECTED. 100 strip 0   rosuvastatin  (CRESTOR ) 5 MG tablet Take 1 tablet (5 mg total) by mouth daily. 90 tablet 0   vitamin C (ASCORBIC ACID) 500 MG tablet Take 500 mg by mouth daily.     vitamin E 180 MG (400 UNITS) capsule Take 400 Units by mouth daily.     No current facility-administered medications on file prior to visit.    BP 138/86   Pulse 87   Temp 98.2 F (36.8 C) (Oral)   Ht 5' 1 (1.549 m)   Wt 161 lb (73 kg)   SpO2 98%   BMI 30.42 kg/m       Objective:   Physical Exam Vitals and nursing note reviewed.  Constitutional:      Appearance: Normal appearance.  Cardiovascular:     Rate and Rhythm: Normal rate and regular rhythm.     Pulses: Normal pulses.     Heart sounds: Normal heart sounds.  Pulmonary:     Effort: Pulmonary effort is normal.     Breath sounds: Normal breath sounds.  Musculoskeletal:        General: Normal range of motion.  Skin:    General: Skin is warm and dry.  Neurological:     Mental Status: She is alert.  Psychiatric:        Mood and Affect: Mood normal.        Behavior: Behavior normal.        Thought Content: Thought content normal.        Cognition and Memory: Cognition is impaired. Memory is impaired. She exhibits impaired recent memory.        Judgment: Judgment normal.       Assessment & Plan:  1. Controlled type 2 diabetes mellitus with complication, without long-term current use of insulin  (HCC) (Primary)  - POC HgB A1c-  7.9 - will increase glipizide  to 5  mg daily  - Follow up in 3 months or sooner if needed - glipiZIDE  (GLUCOTROL  XL) 5 MG 24 hr tablet; Take 1 tablet (5 mg total) by mouth daily with breakfast.  Dispense: 90 tablet; Refill: 1 2. Primary hypertension - Well controlled. No change in medication   Darleene Shape, NP

## 2023-09-20 ENCOUNTER — Encounter: Payer: Self-pay | Admitting: Diagnostic Neuroimaging

## 2023-09-20 ENCOUNTER — Ambulatory Visit: Payer: Self-pay | Admitting: Diagnostic Neuroimaging

## 2023-09-20 VITALS — BP 153/64 | HR 81 | Ht 61.0 in | Wt 151.4 lb

## 2023-09-20 DIAGNOSIS — R413 Other amnesia: Secondary | ICD-10-CM | POA: Diagnosis not present

## 2023-09-20 NOTE — Progress Notes (Signed)
 [facility]  PATIENT: Susan Schmidt DOB: [birth year]  REFERRING CLINICIAN: Shirline Frees, NP HISTORY FROM: patient  REASON FOR VISIT: new consult   HISTORICAL  CHIEF COMPLAINT:  Chief Complaint  Patient presents with   New Patient (Initial Visit)    Pt in room 6. Sister in room. New patient Internal referral for cognitive impairment. Pt was not able to remember her birthday. Sister said memory has worsened  in last 6-8 months. Lives with sister. Pt is very pleasant. Pt was not able to complete MMSE.    HISTORY OF PRESENT ILLNESS:   78 year old female here for evaluation of rapid cognitive decline.  Patient moved in with her sister in December 2023.  Around spring 2024 patient's sister noted a rapid change in her activities, movements, mental, verbal cognitive abilities.  This has progressively rapidly worsening.  Patient's PCP noted some rapid change around October 2024 for an office visit.  Patient needs almost total care to manage her ADLs including bathing, dressing, transferring continence and other tasks.  Not able to handle finances medications food, chores, shopping, transportation issues.  This is a dramatic change compared to a year ago.    REVIEW OF SYSTEMS: Full 14 system review of systems performed and negative with exception of: as per HPI.  ALLERGIES: Allergies  Allergen Reactions   Strawberry Extract Swelling    HOME MEDICATIONS: Outpatient Medications Prior to Visit  Medication Sig Dispense Refill   acetaminophen (TYLENOL) 325 MG tablet Take 325 mg by mouth as needed.     alendronate (FOSAMAX) 70 MG tablet TAKE 1 TABLET EVERY WEEK IN THE MORNING 30 MIN BEFORE EATING WITH AN 8OZ GLASS OF WATER (SIT UP 30 MIN) 12 tablet 3   aspirin EC 81 MG tablet Take 81 mg by mouth daily. Swallow whole.     Biotin 66440 MCG TABS Take 1 tablet daily by mouth.     blood glucose meter kit and supplies KIT Dispense based on patient and insurance  preference. Use up to four times daily as directed. 1 each 0   Blood Glucose Monitoring Suppl (ONE TOUCH ULTRA 2) w/Device KIT Test blood sugar twice daily 1 kit 0   Calcium Carbonate-Vit D-Min (CALCIUM-VITAMIN D-MINERALS) 600-800 MG-UNIT CHEW Chew 1 tablet daily by mouth.     Cholecalciferol (VITAMIN D) 50 MCG (2000 UT) tablet Take 2,000 Units by mouth daily.     Ferrous Sulfate (IRON) 325 (65 Fe) MG TABS Take by mouth.     glipiZIDE (GLUCOTROL XL) 5 MG 24 hr tablet Take 1 tablet (5 mg total) by mouth daily with breakfast. 90 tablet 1   GLUCOSAMINE-CHONDROITIN PO Take 1 tablet by mouth daily. 1500 mg glucosamine and 1200 mg of choindroitan     Lancets (ONETOUCH DELICA PLUS LANCET33G) MISC USE TO TEST BLOOD SUGAR TWICE DAILY AS DIRECTED. 100 each 0   lisinopril (ZESTRIL) 40 MG tablet Take 1 tablet (40 mg total) by mouth daily. 90 tablet 3   Multiple Vitamins-Minerals (MULTIVITAMIN WOMEN 50+) TABS Take 1 tablet daily by mouth.     Omega-3 Fatty Acids (SUPER OMEGA 3 EPA/DHA PO) Take 660 mg by mouth daily.     ONETOUCH ULTRA test strip USE TO TEST BLOOD SUGAR TWICE DAILY AS DIRECTED. 100 strip 0   rosuvastatin (CRESTOR) 5 MG tablet Take 1 tablet (5 mg total) by mouth daily. 90 tablet 0   vitamin C (ASCORBIC ACID) 500 MG tablet Take 500 mg by mouth daily.     vitamin  E 180 MG (400 UNITS) capsule Take 400 Units by mouth daily.     No facility-administered medications prior to visit.    PAST MEDICAL HISTORY: Past Medical History:  Diagnosis Date   DM (diabetes mellitus), type 2, uncontrolled    Hyperlipidemia    Osteoporosis     PAST SURGICAL HISTORY: Past Surgical History:  Procedure Laterality Date   ANKLE SURGERY Right    COLONOSCOPY N/A 07/03/2019   Procedure: COLONOSCOPY;  Surgeon: Malissa Hippo, MD;  Location: [facility];  Service: Endoscopy;  Laterality: N/A;  830   INTRAMEDULLARY (IM) NAIL INTERTROCHANTERIC Left 03/27/2020   Procedure: INTRAMEDULLARY (IM) NAIL  INTERTROCHANTRIC;  Surgeon: Vickki Hearing, MD;  Location: [facility];  Service: Orthopedics;  Laterality: Left;   pilonidal cyst      FAMILY HISTORY: Family History  Problem Relation Age of Onset   Diabetes Mother    Hypertension Mother    Hypertension Father    Heart attack Father    Hypertension Sister    Diabetes Brother    Diabetes Sister     SOCIAL HISTORY: Social History   Socioeconomic History   Marital status: Single    Spouse name: Not on file   Number of children: Not on file   Years of education: Not on file   Highest education level: Not on file  Occupational History   Not on file  Tobacco Use   Smoking status: Former    Current packs/day: 0.00    Average packs/day: 2.0 packs/day for 12.0 years (24.0 ttl pk-yrs)    Types: Cigarettes    Start date: 07/11/1964    Quit date: 07/11/1976    Years since quitting: 47.2   Smokeless tobacco: Never  Vaping Use   Vaping status: Never Used  Substance and Sexual Activity   Alcohol use: No   Drug use: Never   Sexual activity: Not Currently  Other Topics Concern   Not on file  Social History Narrative   Pt lives with sister at home.      Social Drivers of Corporate investment banker Strain: Not on file  Food Insecurity: Not on file  Transportation Needs: Not on file  Physical Activity: Not on file  Stress: Not on file  Social Connections: Not on file  Intimate Partner Violence: Not on file     PHYSICAL EXAM  GENERAL EXAM/CONSTITUTIONAL: Vitals:  Vitals:   09/20/23 1118  BP: (!) 153/64  Pulse: 81  Weight: 151 lb 6.4 oz (68.7 kg)  Height: 5\' 1"  (1.549 m)   Body mass index is 28.61 kg/m. Wt Readings from Last 3 Encounters:  09/20/23 151 lb 6.4 oz (68.7 kg)  07/20/23 161 lb (73 kg)  05/30/23 147 lb (66.7 kg)   Patient is in no distress; well developed, nourished and groomed; neck is supple  CARDIOVASCULAR: Examination of carotid arteries is normal; no carotid bruits Regular rate and rhythm, no  murmurs Examination of peripheral vascular system by observation and palpation is normal  EYES: Ophthalmoscopic exam of optic discs and posterior segments is normal; no papilledema or hemorrhages No results found.  MUSCULOSKELETAL: Gait, strength, tone, movements noted in Neurologic exam below  NEUROLOGIC: MENTAL STATUS:     09/20/2023   11:30 AM 05/30/2023   12:02 PM 04/27/2023    9:53 AM  MMSE - Mini Mental State Exam  Not completed: Unable to complete    Orientation to time  0 0  Orientation to Place  0 0  Registration  1 1  Attention/ Calculation  0 0  Recall  0 0  Language- name 2 objects  1 1  Language- repeat  1 1  Language- follow 3 step command  0 0  Language- read & follow direction  0 0  Write a sentence  0 0  Copy design  0 0  Total score  3 3   awake, alert, oriented to person only: "Mandolin Nau"; does not know year, state, place, her own birthday or her birth place; does not know details of her childhood, family, work life; cannot remember her brother's name; she is tangential at times; perseverative at times [facility] RECALL DECR FUND OF KNOWLEDGE   CRANIAL NERVE:  2nd, 3rd, 4th, 6th - pupils equal and reactive to light, visual fields full to confrontation, extraocular muscles intact, no nystagmus 5th - facial sensation symmetric 7th - facial strength symmetric 8th - hearing intact 9th - palate elevates symmetrically, uvula midline 11th - shoulder shrug symmetric 12th - tongue protrusion midline INTERMITTENT MUSCLE TWITCHING IN FACE AND HANDS  MOTOR:  SLIGHTLY FIDGETING WITH HANDS MILD POSTURAL TREMOR IN BUE  DIFFUSE 4/5  SENSORY:  normal and symmetric to light touch, temperature, vibration  COORDINATION:  finger-nose-finger, fine finger movements normal  REFLEXES:  deep tendon reflexes 1+ and symmetric; SLIGHT STARTLE MYOCLONUS POSITIVE SNOUT REFLEX  GAIT/STATION:  USING WALKER; SLOW TO RISE; STOOPED POSTURE; VERY  UNSTEADY     DIAGNOSTIC DATA (LABS, IMAGING, TESTING) - I reviewed patient records, labs, notes, testing and imaging myself where available.  Lab Results  Component Value Date   WBC 12.4 (H) 04/27/2023   HGB 14.1 04/27/2023   HCT 42.8 04/27/2023   MCV 94.6 04/27/2023   PLT 209.0 04/27/2023      Component Value Date/Time   NA 137 04/27/2023 1128   K 4.1 04/27/2023 1128   CL 101 04/27/2023 1128   CO2 27 04/27/2023 1128   GLUCOSE 187 (H) 04/27/2023 1128   BUN 13 04/27/2023 1128   CREATININE 0.71 04/27/2023 1128   CREATININE 0.69 03/17/2020 0929   CALCIUM 10.2 04/27/2023 1128   PROT 7.7 04/27/2023 1128   ALBUMIN 4.4 04/27/2023 1128   AST 16 04/27/2023 1128   ALT 17 04/27/2023 1128   ALKPHOS 76 04/27/2023 1128   BILITOT 0.6 04/27/2023 1128   GFRNONAA >60 03/29/2020 0722   GFRNONAA 85 03/17/2020 0929   GFRAA >60 03/29/2020 0722   GFRAA 99 03/17/2020 0929   Lab Results  Component Value Date   CHOL 150 04/27/2023   HDL 54.70 04/27/2023   LDLCALC 71 04/27/2023   TRIG 120.0 04/27/2023   CHOLHDL 3 04/27/2023   Lab Results  Component Value Date   HGBA1C 7.9 (A) 07/20/2023   Lab Results  Component Value Date   VITAMINB12 585 03/25/2020   Lab Results  Component Value Date   TSH 3.17 04/27/2023    05/06/23 MRI brain [I reviewed images myself and agree with interpretation. -VRP]  1. No evidence of acute intracranial abnormality. 2. Frontal predominant cerebral atrophy (ICD10-G31.9). 3. Mild for age chronic microvascular ischemic disease.    ASSESSMENT AND PLAN  78 y.o. year old female here with:   Dx:  1. Memory loss       PLAN:  RAPID ONSET CONFUSION, DELIRIUM, SEVERE DEMENTIA CONDITION (since May 2024; considerations include neurodegenerative, autoimmune, paraneoplastic or prion diseases) - check labs and EEG - try to stay active physically and get some exercise (at least 15-30 minutes per day) -  eat a nutritious diet with lean protein, plants /  vegetables, whole grains; avoid ultra-processed foods - increase social activities, brain stimulation, games, puzzles, hobbies, crafts, arts, music; try new activities; keep it fun! - aim for at least 7-8 hours sleep per night (or more) - avoid smoking and alcohol - safety / supervision issues reviewed  Orders Placed This Encounter  Procedures   Autoimmune Neurology Ab   TSH+T3+ThyAbs+TPO Ab+TRAb+T...   Vitamin B12   Vitamin B1   EEG adult   Return in about 3 months (around 12/21/2023) for MyChart visit (15 min).    Suanne Marker, MD 09/20/2023, 12:10 PM Certified in Neurology, Neurophysiology and Neuroimaging  [facility] [address] [place], [place] [address] [phone]

## 2023-09-20 NOTE — Patient Instructions (Signed)
 RAPID ONSET CONFUSION, MEMORY LOSS - check labs and EEG - try to stay active physically and get some exercise (at least 15-30 minutes per day) - eat a nutritious diet with lean protein, plants / vegetables, whole grains; avoid ultra-processed foods - increase social activities, brain stimulation, games, puzzles, hobbies, crafts, arts, music; try new activities; keep it fun! - aim for at least 7-8 hours sleep per night (or more) - avoid smoking and alcohol - safety / supervision issues reviewed

## 2023-09-28 ENCOUNTER — Ambulatory Visit: Admitting: Diagnostic Neuroimaging

## 2023-09-28 DIAGNOSIS — R4182 Altered mental status, unspecified: Secondary | ICD-10-CM | POA: Diagnosis not present

## 2023-09-28 DIAGNOSIS — R413 Other amnesia: Secondary | ICD-10-CM

## 2023-10-02 ENCOUNTER — Other Ambulatory Visit: Payer: Self-pay | Admitting: Adult Health

## 2023-10-04 LAB — AUTOIMMUNE NEUROLOGY AB
AGNA-1: NEGATIVE
AMPA-R1 Antibody: NEGATIVE
AMPA-R2 Antibody: NEGATIVE
Amphiphysin Antibody: NEGATIVE
Anti-Hu Ab: NEGATIVE
Anti-Ri Ab: NEGATIVE
Anti-Yo Ab: NEGATIVE
Antineruonal nuclear Ab Type 3: NEGATIVE
Aquaporin 4 Antibody: NEGATIVE
CASPR2 Antibody,Cell-based IFA: NEGATIVE
CRMP-5 IgG: NEGATIVE
DNER Antibody: NEGATIVE
DPPX Antibody: NEGATIVE
GABA-B-R Antibody: NEGATIVE
GAD65 Antibody: NEGATIVE
ITPR1 Antibody: NEGATIVE
IgLON5 Antibody: NEGATIVE
Interpretation: NEGATIVE
LGI1 Antibody, Cell-based IFA: NEGATIVE
Ma2/Ta Antibody: NEGATIVE
NMDA-R Antibody: NEGATIVE
Purkinje Cell Cyto Ab Type 2: NEGATIVE
Purkinje Cell Cyto Ab Type Tr: NEGATIVE
VGCC Antibody: 1.3 pmol/L (ref 0.0–30.0)
Zic4 Antibody: NEGATIVE
mGluR1 Antibody: NEGATIVE

## 2023-10-04 LAB — TSH+T3+THYABS+TPO AB+TRAB+T...
Free T-3: 3.2 pg/mL
Free T4 by Dialysis: 1.4 ng/dL
Reverse T3,  LCMS Endo Sci: 20.8 ng/dL
TSH Receptor Antibody (TBII): <0.4 U/L
TSH: 4 uU/mL
Triiodothyronine (T-3), Serum: 94 ng/dL

## 2023-10-04 LAB — VITAMIN B12: Vitamin B-12: 801 pg/mL (ref 232–1245)

## 2023-10-04 LAB — VITAMIN B1: Thiamine: 167.5 nmol/L (ref 66.5–200.0)

## 2023-10-12 ENCOUNTER — Encounter: Payer: Self-pay | Admitting: Diagnostic Neuroimaging

## 2023-10-12 NOTE — Procedures (Signed)
   [facility]  EEG (ELECTROENCEPHALOGRAM) REPORT   STUDY DATE: 09/28/23 PATIENT NAME: Susan Schmidt DOB: 10/31/1945 MRN: [id number]  ORDERING CLINICIAN: Joycelyn Schmid, MD   TECHNOLOGIST: Marcheta Grammes TECHNIQUE: Electroencephalogram was recorded utilizing standard 10-20 system of lead placement and reformatted into average and bipolar montages.  RECORDING TIME: 26 minutes ACTIVATION: photic stimulation  CLINICAL INFORMATION: 78 year old female with memory loss.  FINDINGS: Posterior dominant background rhythms, which attenuate with eye opening, ranging 10-11 hertz and 30-35 microvolts. No focal, lateralizing, epileptiform activity or seizures are seen. Patient recorded in the awake and drowsy state. EKG channel shows regular rhythm of 80-85 beats per minute.   IMPRESSION:   Normal EEG in the awake and drowsy states.     INTERPRETING PHYSICIAN:  Suanne Marker, MD Certified in Neurology, Neurophysiology and Neuroimaging  [facility] [address] [place], [place] [address] [phone]

## 2023-10-18 ENCOUNTER — Encounter: Payer: Self-pay | Admitting: Adult Health

## 2023-10-18 ENCOUNTER — Ambulatory Visit (INDEPENDENT_AMBULATORY_CARE_PROVIDER_SITE_OTHER): Payer: Medicare Other | Admitting: Adult Health

## 2023-10-18 VITALS — BP 138/84 | HR 75 | Temp 98.3°F | Ht 61.0 in | Wt 155.0 lb

## 2023-10-18 DIAGNOSIS — M810 Age-related osteoporosis without current pathological fracture: Secondary | ICD-10-CM | POA: Diagnosis not present

## 2023-10-18 DIAGNOSIS — Z7984 Long term (current) use of oral hypoglycemic drugs: Secondary | ICD-10-CM

## 2023-10-18 DIAGNOSIS — E119 Type 2 diabetes mellitus without complications: Secondary | ICD-10-CM

## 2023-10-18 DIAGNOSIS — I10 Essential (primary) hypertension: Secondary | ICD-10-CM | POA: Diagnosis not present

## 2023-10-18 LAB — POCT GLYCOSYLATED HEMOGLOBIN (HGB A1C): Hemoglobin A1C: 7.4 % — AB (ref 4.0–5.6)

## 2023-10-18 MED ORDER — ALENDRONATE SODIUM 70 MG PO TABS
ORAL_TABLET | ORAL | 3 refills | Status: DC
Start: 2023-10-18 — End: 2024-04-30

## 2023-10-18 MED ORDER — GLIPIZIDE ER 5 MG PO TB24: 5.0000 mg | ORAL_TABLET | Freq: Every day | ORAL | 1 refills | Status: DC

## 2023-10-18 NOTE — Patient Instructions (Addendum)
 It was great seeing you today   Your A1c has improved to 7.4 - this is a good reading for you .   B1 is also known as Thiamin. You can also get a B12 complex at the store.   I will see you back in 3 months

## 2023-10-18 NOTE — Progress Notes (Signed)
 Subjective:    Patient ID: Susan Schmidt, female    DOB: Nov 27, 1945, 78 y.o.   MRN: [id number]  HPI  78 year old female who  has a past medical history of DM (diabetes mellitus), type 2, uncontrolled, Hyperlipidemia, and Osteoporosis.  She presents to the office today for follow up regarding DM/HTN. Her sister Wadena Lions is with her today.   DM Type 2 - currently managed with glipizide 5mg  ER daily. She has not been checking her blood sugars at home.  Lab Results  Component Value Date   HGBA1C 7.4 (A) 10/18/2023   HGBA1C 7.9 (A) 07/20/2023   HGBA1C 8.0 (H) 04/27/2023   HTN - Stared on lisinopril 40 mg in November 2024. She has not had any dizziness, lightheadedness, or blurred vision.  BP Readings from Last 3 Encounters:  10/18/23 138/84  09/20/23 (!) 153/64  07/20/23 138/86   Review of Systems See HPI   Past Medical History:  Diagnosis Date   DM (diabetes mellitus), type 2, uncontrolled    Hyperlipidemia    Osteoporosis     Social History   Socioeconomic History   Marital status: Single    Spouse name: Not on file   Number of children: Not on file   Years of education: Not on file   Highest education level: Not on file  Occupational History   Not on file  Tobacco Use   Smoking status: Former    Current packs/day: 0.00    Average packs/day: 2.0 packs/day for 12.0 years (24.0 ttl pk-yrs)    Types: Cigarettes    Start date: 07/11/1964    Quit date: 07/11/1976    Years since quitting: 47.3   Smokeless tobacco: Never  Vaping Use   Vaping status: Never Used  Substance and Sexual Activity   Alcohol use: No   Drug use: Never   Sexual activity: Not Currently  Other Topics Concern   Not on file  Social History Narrative   Pt lives with sister at home.      Social Drivers of Corporate investment banker Strain: Not on file  Food Insecurity: Not on file  Transportation Needs: Not on file  Physical Activity: Not on file  Stress: Not on file  Social  Connections: Not on file  Intimate Partner Violence: Not on file    Past Surgical History:  Procedure Laterality Date   ANKLE SURGERY Right    COLONOSCOPY N/A 07/03/2019   Procedure: COLONOSCOPY;  Surgeon: Malissa Hippo, MD;  [facility];  Service: Endoscopy;  Laterality: N/A;  830   INTRAMEDULLARY (IM) NAIL INTERTROCHANTERIC Left 03/27/2020   Procedure: INTRAMEDULLARY (IM) NAIL INTERTROCHANTRIC;  Surgeon: Vickki Hearing, MD;  Location: [facility];  Service: Orthopedics;  Laterality: Left;   pilonidal cyst      Family History  Problem Relation Age of Onset   Diabetes Mother    Hypertension Mother    Hypertension Father    Heart attack Father    Hypertension Sister    Diabetes Brother    Diabetes Sister     Allergies  Allergen Reactions   Strawberry Extract Swelling    Current Outpatient Medications on File Prior to Visit  Medication Sig Dispense Refill   acetaminophen (TYLENOL) 325 MG tablet Take 325 mg by mouth as needed.     aspirin EC 81 MG tablet Take 81 mg by mouth daily. Swallow whole.     Biotin 27253 MCG TABS Take 1 tablet daily by mouth.  blood glucose meter kit and supplies KIT Dispense based on patient and insurance preference. Use up to four times daily as directed. 1 each 0   Blood Glucose Monitoring Suppl (ONE TOUCH ULTRA 2) w/Device KIT Test blood sugar twice daily 1 kit 0   Calcium Carbonate-Vit D-Min (CALCIUM-VITAMIN D-MINERALS) 600-800 MG-UNIT CHEW Chew 1 tablet daily by mouth.     Cholecalciferol (VITAMIN D) 50 MCG (2000 UT) tablet Take 2,000 Units by mouth daily.     cyanocobalamin (VITAMIN B12) 500 MCG tablet Take 500 mcg by mouth daily.     Ferrous Sulfate (IRON) 325 (65 Fe) MG TABS Take by mouth.     GLUCOSAMINE-CHONDROITIN PO Take 1 tablet by mouth daily. 1500 mg glucosamine and 1200 mg of choindroitan     Lancets (ONETOUCH DELICA PLUS LANCET33G) MISC USE TO TEST BLOOD SUGAR TWICE DAILY AS DIRECTED. 100 each 0   lisinopril (ZESTRIL)  40 MG tablet Take 1 tablet (40 mg total) by mouth daily. 90 tablet 3   Multiple Vitamins-Minerals (MULTIVITAMIN WOMEN 50+) TABS Take 1 tablet daily by mouth.     Omega-3 Fatty Acids (SUPER OMEGA 3 EPA/DHA PO) Take 660 mg by mouth daily.     ONETOUCH ULTRA test strip USE TO TEST BLOOD SUGAR TWICE DAILY AS DIRECTED. 100 strip 0   rosuvastatin (CRESTOR) 5 MG tablet Take 1 tablet (5 mg total) by mouth daily. 90 tablet 0   vitamin C (ASCORBIC ACID) 500 MG tablet Take 500 mg by mouth daily.     vitamin E 180 MG (400 UNITS) capsule Take 400 Units by mouth daily.     No current facility-administered medications on file prior to visit.    BP 138/84   Pulse 75   Temp 98.3 F (36.8 C) (Oral)   Ht 5\' 1"  (1.549 m)   Wt 155 lb (70.3 kg)   SpO2 97%   BMI 29.29 kg/m       Objective:   Physical Exam Vitals and nursing note reviewed.  Constitutional:      Appearance: Normal appearance.  Cardiovascular:     Rate and Rhythm: Normal rate and regular rhythm.     Pulses: Normal pulses.     Heart sounds: Normal heart sounds.  Pulmonary:     Effort: Pulmonary effort is normal.     Breath sounds: Normal breath sounds.  Skin:    General: Skin is warm and dry.  Neurological:     Mental Status: She is alert. Mental status is at baseline. She is disoriented.     Gait: Gait abnormal (walks with rolling walker).  Psychiatric:        Mood and Affect: Mood normal.        Behavior: Behavior normal.        Thought Content: Thought content normal.        Judgment: Judgment normal.       Assessment & Plan:  1. Diabetes mellitus treated with oral medication (HCC) (Primary) - POC HgB A1c- 7.4  - A1c has improved and due to age and health status I am ok with this reading - Continue with Glipizide 5 mg ER  - glipiZIDE (GLUCOTROL XL) 5 MG 24 hr tablet; Take 1 tablet (5 mg total) by mouth daily with breakfast.  Dispense: 90 tablet; Refill: 1  2. Primary hypertension - At goal.  - No change in medication    3. Age-related osteoporosis without current pathological fracture - Needs refill  - alendronate (FOSAMAX) 70 MG tablet;  TAKE 1 TABLET EVERY WEEK IN THE MORNING 30 MIN BEFORE EATING WITH AN 8OZ GLASS OF WATER (SIT UP 30 MIN)  Dispense: 12 tablet; Refill: 3   Shirline Frees, NP

## 2023-11-29 ENCOUNTER — Other Ambulatory Visit: Payer: Self-pay | Admitting: Adult Health

## 2023-11-29 DIAGNOSIS — Z1231 Encounter for screening mammogram for malignant neoplasm of breast: Secondary | ICD-10-CM

## 2023-12-20 ENCOUNTER — Ambulatory Visit

## 2023-12-20 ENCOUNTER — Ambulatory Visit
Admission: RE | Admit: 2023-12-20 | Discharge: 2023-12-20 | Disposition: A | Discharge status: Home / Self Care | Source: Ambulatory Visit | Attending: Adult Health | Admitting: Adult Health

## 2023-12-20 DIAGNOSIS — Z1231 Encounter for screening mammogram for malignant neoplasm of breast: Secondary | ICD-10-CM

## 2023-12-26 ENCOUNTER — Encounter: Payer: Self-pay | Admitting: Diagnostic Neuroimaging

## 2023-12-26 ENCOUNTER — Ambulatory Visit: Admitting: Diagnostic Neuroimaging

## 2023-12-26 ENCOUNTER — Telehealth: Admitting: Diagnostic Neuroimaging

## 2023-12-26 VITALS — BP 121/79 | HR 76 | Ht 61.0 in | Wt 156.0 lb

## 2023-12-26 DIAGNOSIS — F03C0 Unspecified dementia, severe, without behavioral disturbance, psychotic disturbance, mood disturbance, and anxiety: Secondary | ICD-10-CM | POA: Diagnosis not present

## 2023-12-26 NOTE — Progress Notes (Signed)
 [facility]  PATIENT: Susan Schmidt DOB: [birth year]  REFERRING CLINICIAN: Alto Atta, NP HISTORY FROM: patient  REASON FOR VISIT: follow up   HISTORICAL  CHIEF COMPLAINT:  Chief Complaint  Patient presents with   Memory Loss    Rm 7 with sister Pt is well and stable, sister reports memory is about the same as last visit. No new concerns     HISTORY OF PRESENT ILLNESS:   UPDATE (12/26/23, VRP): Since last visit, symptoms are stable / slightly progressive. Still living with sister. Needs significant help with ADLs.   PRIOR HPI (09/20/23): 78 year old female here for evaluation of rapid cognitive decline.  Patient moved in with her sister in December 2023.  Around spring 2024 patient's sister noted a rapid change in her activities, movements, mental, verbal cognitive abilities.  This has progressively rapidly worsening.  Patient's PCP noted some rapid change around October 2024 for an office visit.  Patient needs almost total care to manage her ADLs including bathing, dressing, transferring continence and other tasks.  Not able to handle finances medications food, chores, shopping, transportation issues.  This is a dramatic change compared to a year ago.    REVIEW OF SYSTEMS: Full 14 system review of systems performed and negative with exception of: as per HPI.  ALLERGIES: Allergies  Allergen Reactions   Strawberry Extract Swelling    HOME MEDICATIONS: Outpatient Medications Prior to Visit  Medication Sig Dispense Refill   acetaminophen  (TYLENOL ) 325 MG tablet Take 325 mg by mouth as needed.     alendronate  (FOSAMAX ) 70 MG tablet TAKE 1 TABLET EVERY WEEK IN THE MORNING 30 MIN BEFORE EATING WITH AN 8OZ GLASS OF WATER  (SIT UP 30 MIN) 12 tablet 3   Biotin 10000 MCG TABS Take 1 tablet daily by mouth.     blood glucose meter kit and supplies KIT Dispense based on patient and insurance preference. Use up to four times daily as directed. 1 each 0    Blood Glucose Monitoring Suppl (ONE TOUCH ULTRA 2) w/Device KIT Test blood sugar twice daily 1 kit 0   Calcium  Carbonate-Vit D-Min (CALCIUM -VITAMIN D -MINERALS) 600-800 MG-UNIT CHEW Chew 1 tablet daily by mouth.     Cholecalciferol (VITAMIN D ) 50 MCG (2000 UT) tablet Take 2,000 Units by mouth daily.     cyanocobalamin  (VITAMIN B12) 500 MCG tablet Take 500 mcg by mouth daily.     Ferrous Sulfate (IRON ) 325 (65 Fe) MG TABS Take by mouth.     glipiZIDE  (GLUCOTROL  XL) 5 MG 24 hr tablet Take 1 tablet (5 mg total) by mouth daily with breakfast. 90 tablet 1   Lancets (ONETOUCH DELICA PLUS LANCET33G) MISC USE TO TEST BLOOD SUGAR TWICE DAILY AS DIRECTED. 100 each 0   lisinopril  (ZESTRIL ) 40 MG tablet Take 1 tablet (40 mg total) by mouth daily. 90 tablet 3   Multiple Vitamins-Minerals (MULTIVITAMIN WOMEN 50+) TABS Take 1 tablet daily by mouth.     Omega-3 Fatty Acids (SUPER OMEGA 3 EPA/DHA PO) Take 660 mg by mouth daily.     ONETOUCH ULTRA test strip USE TO TEST BLOOD SUGAR TWICE DAILY AS DIRECTED. 100 strip 0   rosuvastatin  (CRESTOR ) 5 MG tablet Take 1 tablet (5 mg total) by mouth daily. 90 tablet 0   vitamin C (ASCORBIC ACID) 500 MG tablet Take 500 mg by mouth daily.     vitamin E 180 MG (400 UNITS) capsule Take 400 Units by mouth daily.     aspirin  EC 81 MG  tablet Take 81 mg by mouth daily. Swallow whole. (Patient not taking: Reported on 12/26/2023)     GLUCOSAMINE-CHONDROITIN PO Take 1 tablet by mouth daily. 1500 mg glucosamine and 1200 mg of choindroitan (Patient not taking: Reported on 12/26/2023)     No facility-administered medications prior to visit.    PAST MEDICAL HISTORY: Past Medical History:  Diagnosis Date   DM (diabetes mellitus), type 2, uncontrolled    Hyperlipidemia    Osteoporosis     PAST SURGICAL HISTORY: Past Surgical History:  Procedure Laterality Date   ANKLE SURGERY Right    COLONOSCOPY N/A 07/03/2019   Procedure: COLONOSCOPY;  Surgeon: Ruby Corporal, MD;  [facility];  Service: Endoscopy;  Laterality: N/A;  830   INTRAMEDULLARY (IM) NAIL INTERTROCHANTERIC Left 03/27/2020   Procedure: INTRAMEDULLARY (IM) NAIL INTERTROCHANTRIC;  Surgeon: Darrin Emerald, MD;  Location: [facility];  Service: Orthopedics;  Laterality: Left;   pilonidal cyst      FAMILY HISTORY: Family History  Problem Relation Age of Onset   Diabetes Mother    Hypertension Mother    Hypertension Father    Heart attack Father    Hypertension Sister    Diabetes Sister    Diabetes Brother    Breast cancer Neg Hx    BRCA 1/2 Neg Hx     SOCIAL HISTORY: Social History   Socioeconomic History   Marital status: Single    Spouse name: Not on file   Number of children: Not on file   Years of education: Not on file   Highest education level: Not on file  Occupational History   Not on file  Tobacco Use   Smoking status: Former    Current packs/day: 0.00    Average packs/day: 2.0 packs/day for 12.0 years (24.0 ttl pk-yrs)    Types: Cigarettes    Start date: 07/11/1964    Quit date: 07/11/1976    Years since quitting: 47.4   Smokeless tobacco: Never  Vaping Use   Vaping status: Never Used  Substance and Sexual Activity   Alcohol use: No   Drug use: Never   Sexual activity: Not Currently  Other Topics Concern   Not on file  Social History Narrative   Pt lives with sister at home.      Social Drivers of Corporate investment banker Strain: Not on file  Food Insecurity: Not on file  Transportation Needs: Not on file  Physical Activity: Not on file  Stress: Not on file  Social Connections: Not on file  Intimate Partner Violence: Not on file     PHYSICAL EXAM  GENERAL EXAM/CONSTITUTIONAL: Vitals:  Vitals:   12/26/23 1031  BP: 121/79  Pulse: 76  Weight: 156 lb (70.8 kg)  Height: 5' 1 (1.549 m)   Body mass index is 29.48 kg/m. Wt Readings from Last 3 Encounters:  12/26/23 156 lb (70.8 kg)  10/18/23 155 lb (70.3 kg)  09/20/23 151 lb 6.4 oz (68.7 kg)    Patient is in no distress; well developed, nourished and groomed; neck is supple  CARDIOVASCULAR: Examination of carotid arteries is normal; no carotid bruits Regular rate and rhythm, no murmurs Examination of peripheral vascular system by observation and palpation is normal  EYES: Ophthalmoscopic exam of optic discs and posterior segments is normal; no papilledema or hemorrhages No results found.  MUSCULOSKELETAL: Gait, strength, tone, movements noted in Neurologic exam below  NEUROLOGIC: MENTAL STATUS:     09/20/2023   11:30 AM 05/30/2023  12:02 PM 04/27/2023    9:53 AM  MMSE - Mini Mental State Exam  Not completed: Unable to complete    Orientation to time  0 0  Orientation to Place  0 0  Registration  1 1  Attention/ Calculation  0 0  Recall  0 0  Language- name 2 objects  1 1  Language- repeat  1 1  Language- follow 3 step command  0 0  Language- read & follow direction  0 0  Write a sentence  0 0  Copy design  0 0  Total score  3 3   awake, alert, oriented to person only: Kaiesha Hagin; does not know year, state, place, her own birthday or her birth place; cannot name months of the year; does not know details of her childhood, family, work life; she is tangential at times; perseverative at times [facility] RECALL DECR FUND OF KNOWLEDGE   CRANIAL NERVE:  2nd, 3rd, 4th, 6th - pupils equal and reactive to light, visual fields full to confrontation, extraocular muscles intact, no nystagmus 5th - facial sensation symmetric 7th - facial strength symmetric 8th - hearing intact 9th - palate elevates symmetrically, uvula midline 11th - shoulder shrug symmetric 12th - tongue protrusion midline  MOTOR:  MILD POSTURAL TREMOR IN BUE  DIFFUSE 4/5  SENSORY:  normal and symmetric to light touch, temperature, vibration  COORDINATION:  finger-nose-finger, fine finger movements normal  REFLEXES:  deep tendon reflexes 1+ and symmetric POSITIVE SNOUT  REFLEX  GAIT/STATION:  USING WALKER; SLOW TO RISE; STOOPED POSTURE; VERY UNSTEADY     DIAGNOSTIC DATA (LABS, IMAGING, TESTING) - I reviewed patient records, labs, notes, testing and imaging myself where available.  Lab Results  Component Value Date   WBC 12.4 (H) 04/27/2023   HGB 14.1 04/27/2023   HCT 42.8 04/27/2023   MCV 94.6 04/27/2023   PLT 209.0 04/27/2023      Component Value Date/Time   NA 137 04/27/2023 1128   K 4.1 04/27/2023 1128   CL 101 04/27/2023 1128   CO2 27 04/27/2023 1128   GLUCOSE 187 (H) 04/27/2023 1128   BUN 13 04/27/2023 1128   CREATININE 0.71 04/27/2023 1128   CREATININE 0.69 03/17/2020 0929   CALCIUM  10.2 04/27/2023 1128   PROT 7.7 04/27/2023 1128   ALBUMIN 4.4 04/27/2023 1128   AST 16 04/27/2023 1128   ALT 17 04/27/2023 1128   ALKPHOS 76 04/27/2023 1128   BILITOT 0.6 04/27/2023 1128   GFRNONAA >60 03/29/2020 0722   GFRNONAA 85 03/17/2020 0929   GFRAA >60 03/29/2020 0722   GFRAA 99 03/17/2020 0929   Lab Results  Component Value Date   CHOL 150 04/27/2023   HDL 54.70 04/27/2023   LDLCALC 71 04/27/2023   TRIG 120.0 04/27/2023   CHOLHDL 3 04/27/2023   Lab Results  Component Value Date   HGBA1C 7.4 (A) 10/18/2023   Lab Results  Component Value Date   VITAMINB12 801 09/20/2023   Component Ref Range & Units (hover) 3 mo ago  Thiamine  167.5   Lab Results  Component Value Date   TSH 3.17 04/27/2023   Anti-Thyroid  Peroxidase Ab <9.0  Comment: Reference Range: All ages: <9.0  Anti-Thyroglobulin Antibodies <1.0   Paraneoplastic panel Component Ref Range & Units (hover) 3 mo ago  Interpretation Negative    05/06/23 MRI brain [I reviewed images myself and agree with interpretation. -VRP]  1. No evidence of acute intracranial abnormality. 2. Frontal predominant cerebral atrophy (ICD10-G31.9). 3. Mild for age  chronic microvascular ischemic disease.  09/28/23 EEG - normal    ASSESSMENT AND PLAN  78 y.o. year old female here  with:   Dx:  1. Severe dementia without behavioral disturbance, psychotic disturbance, mood disturbance, or anxiety, unspecified dementia type [facility])     PLAN:  SEVERE DEMENTIA (since May 2024; rapid progression; living with sister since Dec 2023 for leg fracture) - check ATN panel (to confirm dementia type vs other diagnosis) - eat a nutritious diet with lean protein, plants / vegetables, whole grains; avoid ultra-processed foods - increase social activities, brain stimulation, games, puzzles, hobbies, crafts, arts, music; try new activities; keep it fun! - aim for at least 7-8 hours sleep per night (or more) - avoid smoking and alcohol - safety / supervision issues reviewed - patient not capable of handling her finances or own personal affairs; needs to be assigned guardian or POA  Orders Placed This Encounter  Procedures   ATN PROFILE   Return for return to PCP.    Omega Bible, MD 12/26/2023, 10:47 AM Certified in Neurology, Neurophysiology and Neuroimaging  [facility] [address], [address] [place], [place] [address] [phone]

## 2023-12-26 NOTE — Patient Instructions (Signed)
 SEVERE DEMENTIA (since May 2024) - try to stay active physically and get some exercise (at least 15-30 minutes per day) - eat a nutritious diet with lean protein, plants / vegetables, whole grains; avoid ultra-processed foods - increase social activities, brain stimulation, games, puzzles, hobbies, crafts, arts, music; try new activities; keep it fun! - aim for at least 7-8 hours sleep per night (or more) - safety / supervision issues reviewed; cannot live alone - patient not capable of handling her finances or own personal affairs; needs to be assigned guardian or POA

## 2023-12-28 ENCOUNTER — Other Ambulatory Visit: Payer: Self-pay | Admitting: Adult Health

## 2023-12-29 LAB — ATN PROFILE
A -- Beta-amyloid 42/40 Ratio: 0.097 — ABNORMAL LOW (ref >0.102)
Beta-amyloid 40: 201.73 pg/mL
Beta-amyloid 42: 19.62 pg/mL
N -- NfL, Plasma: 4.52 pg/mL (ref 0.00–6.04)
T -- p-tau181: 2.36 pg/mL — ABNORMAL HIGH (ref 0.00–0.97)

## 2024-01-11 ENCOUNTER — Ambulatory Visit: Payer: Self-pay | Admitting: Neurology

## 2024-01-17 ENCOUNTER — Encounter: Payer: Self-pay | Admitting: Adult Health

## 2024-01-17 ENCOUNTER — Ambulatory Visit: Admitting: Adult Health

## 2024-01-17 VITALS — BP 132/84 | HR 85 | Temp 97.8°F | Ht 61.0 in | Wt 155.0 lb

## 2024-01-17 DIAGNOSIS — I10 Essential (primary) hypertension: Secondary | ICD-10-CM | POA: Diagnosis not present

## 2024-01-17 DIAGNOSIS — G309 Alzheimer's disease, unspecified: Secondary | ICD-10-CM | POA: Diagnosis not present

## 2024-01-17 DIAGNOSIS — Z7984 Long term (current) use of oral hypoglycemic drugs: Secondary | ICD-10-CM | POA: Diagnosis not present

## 2024-01-17 DIAGNOSIS — E119 Type 2 diabetes mellitus without complications: Secondary | ICD-10-CM | POA: Diagnosis not present

## 2024-01-17 DIAGNOSIS — F0280 Dementia in other diseases classified elsewhere without behavioral disturbance: Secondary | ICD-10-CM

## 2024-01-17 LAB — POCT GLYCOSYLATED HEMOGLOBIN (HGB A1C): Hemoglobin A1C: 8.2 % — AB (ref 4.0–5.6)

## 2024-01-17 MED ORDER — GLIPIZIDE ER 10 MG PO TB24: 10.0000 mg | ORAL_TABLET | Freq: Every day | ORAL | 1 refills | Status: DC

## 2024-01-17 NOTE — Patient Instructions (Addendum)
 It was great seeing you today.   Your A1c has increased to 8.2.   I am going to increase your Glipizide  to 10 mg daily.   Lets follow up in 3 months

## 2024-01-17 NOTE — Progress Notes (Signed)
 Subjective:    Patient ID: Susan Schmidt, female    DOB: [birth year], 78 y.o.   MRN: [id number]  HPI 78 year old female who  has a past medical history of Dementia (HCC), DM (diabetes mellitus), type 2, uncontrolled, Hyperlipidemia, and Osteoporosis.  She presents to the office today for for follow up regarding DM and HTN. She is with her sister today.   DM Type 2 - currently managed with glipizide  5 mg ER daily. She has not been checking her blood sugars at home. She has been snacking more on sweets.  Lab Results  Component Value Date   HGBA1C 8.2 (A) 01/17/2024   HGBA1C 7.4 (A) 10/18/2023   HGBA1C 7.9 (A) 07/20/2023   HTN - Stared on lisinopril  40 mg in November 2024. She has not had any dizziness, lightheadedness, or blurred vision.  BP Readings from Last 3 Encounters:  01/17/24 132/84  12/26/23 121/79  10/18/23 138/84   Dementia - was recently seen by Neurology and since her dementia is pretty severe she is not able to handle personal affairs or finances and has a hard time doing her ADLs it was recommended by neurology that she have a guardian or POA.  Her sister Roselind reports that she went to a lawyer but the lawyer requested a letter from one of her providers in order to gain access to be a POA.  Review of Systems See HPI   Past Medical History:  Diagnosis Date   Dementia (HCC)    DM (diabetes mellitus), type 2, uncontrolled    Hyperlipidemia    Osteoporosis     Social History   Socioeconomic History   Marital status: Single    Spouse name: Not on file   Number of children: Not on file   Years of education: Not on file   Highest education level: Not on file  Occupational History   Not on file  Tobacco Use   Smoking status: Former    Current packs/day: 0.00    Average packs/day: 2.0 packs/day for 12.0 years (24.0 ttl pk-yrs)    Types: Cigarettes    Start date: 07/11/1964    Quit date: 07/11/1976    Years since quitting: 47.5   Smokeless tobacco: Never   Vaping Use   Vaping status: Never Used  Substance and Sexual Activity   Alcohol use: No   Drug use: Never   Sexual activity: Not Currently  Other Topics Concern   Not on file  Social History Narrative   Pt lives with sister at home.      Social Drivers of Corporate investment banker Strain: Not on file  Food Insecurity: Not on file  Transportation Needs: Not on file  Physical Activity: Not on file  Stress: Not on file  Social Connections: Not on file  Intimate Partner Violence: Not on file    Past Surgical History:  Procedure Laterality Date   ANKLE SURGERY Right    COLONOSCOPY N/A 07/03/2019   Procedure: COLONOSCOPY;  Surgeon: Golda Claudis PENNER, MD;  [facility];  Service: Endoscopy;  Laterality: N/A;  830   INTRAMEDULLARY (IM) NAIL INTERTROCHANTERIC Left 03/27/2020   Procedure: INTRAMEDULLARY (IM) NAIL INTERTROCHANTRIC;  Surgeon: Margrette Taft BRAVO, MD;  Location: [facility];  Service: Orthopedics;  Laterality: Left;   pilonidal cyst      Family History  Problem Relation Age of Onset   Diabetes Mother    Hypertension Mother    Hypertension Father    Heart  attack Father    Hypertension Sister    Diabetes Sister    Diabetes Brother    Breast cancer Neg Hx    BRCA 1/2 Neg Hx     Allergies  Allergen Reactions   Strawberry Extract Swelling    Current Outpatient Medications on File Prior to Visit  Medication Sig Dispense Refill   acetaminophen  (TYLENOL ) 325 MG tablet Take 325 mg by mouth as needed.     alendronate  (FOSAMAX ) 70 MG tablet TAKE 1 TABLET EVERY WEEK IN THE MORNING 30 MIN BEFORE EATING WITH AN 8OZ GLASS OF WATER  (SIT UP 30 MIN) 12 tablet 3   Biotin 10000 MCG TABS Take 1 tablet daily by mouth.     blood glucose meter kit and supplies KIT Dispense based on patient and insurance preference. Use up to four times daily as directed. 1 each 0   Blood Glucose Monitoring Suppl (ONE TOUCH ULTRA 2) w/Device KIT Test blood sugar twice daily 1 kit 0    Calcium  Carbonate-Vit D-Min (CALCIUM -VITAMIN D -MINERALS) 600-800 MG-UNIT CHEW Chew 1 tablet daily by mouth.     Cholecalciferol (VITAMIN D ) 50 MCG (2000 UT) tablet Take 2,000 Units by mouth daily.     cyanocobalamin  (VITAMIN B12) 500 MCG tablet Take 500 mcg by mouth daily.     Ferrous Sulfate (IRON ) 325 (65 Fe) MG TABS Take by mouth.     Lancets (ONETOUCH DELICA PLUS LANCET33G) MISC USE TO TEST BLOOD SUGAR TWICE DAILY AS DIRECTED. 100 each 0   lisinopril  (ZESTRIL ) 40 MG tablet Take 1 tablet (40 mg total) by mouth daily. 90 tablet 3   Multiple Vitamins-Minerals (MULTIVITAMIN WOMEN 50+) TABS Take 1 tablet daily by mouth.     Omega-3 Fatty Acids (SUPER OMEGA 3 EPA/DHA PO) Take 660 mg by mouth daily.     ONETOUCH ULTRA test strip USE TO TEST BLOOD SUGAR TWICE DAILY AS DIRECTED. 100 strip 0   rosuvastatin  (CRESTOR ) 5 MG tablet TAKE ONE TABLET BY MOUTH DAILY 90 tablet 0   vitamin C (ASCORBIC ACID) 500 MG tablet Take 500 mg by mouth daily.     vitamin E 180 MG (400 UNITS) capsule Take 400 Units by mouth daily.     No current facility-administered medications on file prior to visit.    BP 132/84   Pulse 85   Temp 97.8 F (36.6 C) (Oral)   Ht 5' 1 (1.549 m)   Wt 155 lb (70.3 kg)   SpO2 97%   BMI 29.29 kg/m       Objective:   Physical Exam Vitals and nursing note reviewed.  Constitutional:      Appearance: Normal appearance.  Cardiovascular:     Rate and Rhythm: Normal rate and regular rhythm.     Pulses: Normal pulses.     Heart sounds: Normal heart sounds.  Pulmonary:     Effort: Pulmonary effort is normal.     Breath sounds: Normal breath sounds.  Skin:    General: Skin is warm and dry.  Neurological:     Mental Status: She is alert. Mental status is at baseline. She is disoriented.     Gait: Gait abnormal (slow steady gait with walker).  Psychiatric:        Mood and Affect: Mood normal.        Behavior: Behavior normal.        Thought Content: Thought content normal.         Judgment: Judgment normal.  Assessment & Plan:  1. Diabetes mellitus treated with oral medication (HCC) (Primary)  - POC HgB A1c- 8.2 - has increased. Likely due to snacking. Will increase her glipizide  to 10 mg ER daily.  - Follow up in 3 months for CPE  - glipiZIDE  (GLUCOTROL  XL) 10 MG 24 hr tablet; Take 1 tablet (10 mg total) by mouth daily with breakfast.  Dispense: 90 tablet; Refill: 1  2. Primary hypertension - Controlled. No change in medication   3. Alzheimer's disease [facility]) - letter written for Shakemia's sisters to become POA  Darleene Shape, NP

## 2024-01-18 NOTE — Telephone Encounter (Signed)
 Called the pt to review the blood work. There was no answer. LVM asking for a call back

## 2024-01-18 NOTE — Telephone Encounter (Signed)
-----   Message from Onetha KATHEE Epp sent at 01/11/2024  1:38 PM EDT ----- Blood test is consistent with alzheimer's disease. Thank you ----- Message ----- From: Delfino Augustin BROCKS, RN Sent: 01/11/2024  10:48 AM EDT To: Onetha KATHEE Epp, MD  Please review and result for pt ----- Message ----- From: Rebecka Memos Lab Results In Sent: 12/28/2023   4:36 PM EDT To: Eduard JONELLE Hanlon, MD

## 2024-01-24 DIAGNOSIS — M79674 Pain in right toe(s): Secondary | ICD-10-CM | POA: Diagnosis not present

## 2024-01-24 DIAGNOSIS — E1140 Type 2 diabetes mellitus with diabetic neuropathy, unspecified: Secondary | ICD-10-CM | POA: Diagnosis not present

## 2024-01-24 DIAGNOSIS — L609 Nail disorder, unspecified: Secondary | ICD-10-CM | POA: Diagnosis not present

## 2024-01-24 DIAGNOSIS — M79675 Pain in left toe(s): Secondary | ICD-10-CM | POA: Diagnosis not present

## 2024-01-24 DIAGNOSIS — M79672 Pain in left foot: Secondary | ICD-10-CM | POA: Diagnosis not present

## 2024-01-24 DIAGNOSIS — I739 Peripheral vascular disease, unspecified: Secondary | ICD-10-CM | POA: Diagnosis not present

## 2024-01-24 DIAGNOSIS — M79671 Pain in right foot: Secondary | ICD-10-CM | POA: Diagnosis not present

## 2024-01-29 ENCOUNTER — Telehealth: Payer: Self-pay | Admitting: Diagnostic Neuroimaging

## 2024-01-29 NOTE — Telephone Encounter (Signed)
 Attempted to call Pt sister listed on DPR. No answer, LVM for call back.

## 2024-01-29 NOTE — Telephone Encounter (Signed)
 Pt's sister Roselind called wanting the pt's lab results but unfortunately she is not on the DPR. Sister Roselind was informed and she stated that the pt has dementia and can not fill out an updated DPR. I informed her that unfortunately whomever is on the DPR would have to come in and show ID so that the paperwork can be updated.

## 2024-02-15 ENCOUNTER — Other Ambulatory Visit: Payer: Self-pay | Admitting: Adult Health

## 2024-02-15 DIAGNOSIS — E118 Type 2 diabetes mellitus with unspecified complications: Secondary | ICD-10-CM

## 2024-02-16 ENCOUNTER — Other Ambulatory Visit: Payer: Self-pay

## 2024-02-16 MED ORDER — ACCU-CHEK GUIDE W/DEVICE KIT: PACK | 0 refills | Status: AC

## 2024-02-16 NOTE — Telephone Encounter (Unsigned)
 Copied from CRM #[id number]. Topic: Clinical - Prescription Issue >> Feb 16, 2024  3:23 PM Deleta RAMAN wrote: Reason for CRM: patient has an issue with the lancets (onetouch delica plus lancet33g) she would need the meter for her strips she has which is accu chek. Please contact [phone]

## 2024-02-16 NOTE — Telephone Encounter (Unsigned)
 Copied from CRM #[id number]. Topic: Clinical - Prescription Issue >> Feb 16, 2024  4:35 PM Delon DASEN wrote: Reason for CRM: Pharmacy needs prescription for Accucheck meter- they filled the test strips but no meter- sister Roselind  [phone]

## 2024-02-16 NOTE — Telephone Encounter (Signed)
 Pt need a new meter due to insurance coverage. Pt request accu-chek guide. Meter sent in pt notified of update

## 2024-03-27 ENCOUNTER — Other Ambulatory Visit: Payer: Self-pay | Admitting: Adult Health

## 2024-04-11 DIAGNOSIS — I739 Peripheral vascular disease, unspecified: Secondary | ICD-10-CM | POA: Diagnosis not present

## 2024-04-11 DIAGNOSIS — L609 Nail disorder, unspecified: Secondary | ICD-10-CM | POA: Diagnosis not present

## 2024-04-11 DIAGNOSIS — M79674 Pain in right toe(s): Secondary | ICD-10-CM | POA: Diagnosis not present

## 2024-04-11 DIAGNOSIS — M79671 Pain in right foot: Secondary | ICD-10-CM | POA: Diagnosis not present

## 2024-04-11 DIAGNOSIS — M79672 Pain in left foot: Secondary | ICD-10-CM | POA: Diagnosis not present

## 2024-04-11 DIAGNOSIS — E1140 Type 2 diabetes mellitus with diabetic neuropathy, unspecified: Secondary | ICD-10-CM | POA: Diagnosis not present

## 2024-04-11 DIAGNOSIS — M79675 Pain in left toe(s): Secondary | ICD-10-CM | POA: Diagnosis not present

## 2024-04-30 ENCOUNTER — Ambulatory Visit: Admitting: Adult Health

## 2024-04-30 ENCOUNTER — Encounter: Payer: Self-pay | Admitting: Adult Health

## 2024-04-30 VITALS — BP 122/80 | HR 74 | Temp 98.1°F | Ht 61.0 in | Wt 155.0 lb

## 2024-04-30 DIAGNOSIS — Z7984 Long term (current) use of oral hypoglycemic drugs: Secondary | ICD-10-CM

## 2024-04-30 DIAGNOSIS — I10 Essential (primary) hypertension: Secondary | ICD-10-CM | POA: Diagnosis not present

## 2024-04-30 DIAGNOSIS — G309 Alzheimer's disease, unspecified: Secondary | ICD-10-CM | POA: Diagnosis not present

## 2024-04-30 DIAGNOSIS — E119 Type 2 diabetes mellitus without complications: Secondary | ICD-10-CM | POA: Diagnosis not present

## 2024-04-30 DIAGNOSIS — M810 Age-related osteoporosis without current pathological fracture: Secondary | ICD-10-CM | POA: Diagnosis not present

## 2024-04-30 DIAGNOSIS — Z0000 Encounter for general adult medical examination without abnormal findings: Secondary | ICD-10-CM

## 2024-04-30 DIAGNOSIS — Z23 Encounter for immunization: Secondary | ICD-10-CM

## 2024-04-30 DIAGNOSIS — F0280 Dementia in other diseases classified elsewhere without behavioral disturbance: Secondary | ICD-10-CM

## 2024-04-30 LAB — CBC WITH DIFFERENTIAL/PLATELET
Basophils Absolute: 0 K/uL (ref 0.0–0.1)
Basophils Relative: 0.4 % (ref 0.0–3.0)
Eosinophils Absolute: 0.1 K/uL (ref 0.0–0.7)
Eosinophils Relative: 1.1 % (ref 0.0–5.0)
HCT: 41.8 % (ref 36.0–46.0)
Hemoglobin: 14.3 g/dL (ref 12.0–15.0)
Lymphocytes Relative: 16.9 % (ref 12.0–46.0)
Lymphs Abs: 1.5 K/uL (ref 0.7–4.0)
MCHC: 34.1 g/dL (ref 30.0–36.0)
MCV: 91.6 fl (ref 78.0–100.0)
Monocytes Absolute: 0.6 K/uL (ref 0.1–1.0)
Monocytes Relative: 6.8 % (ref 3.0–12.0)
Neutro Abs: 6.5 K/uL (ref 1.4–7.7)
Neutrophils Relative %: 74.8 % (ref 43.0–77.0)
Platelets: 228 K/uL (ref 150.0–400.0)
RBC: 4.57 Mil/uL (ref 3.87–5.11)
RDW: 13.1 % (ref 11.5–15.5)
WBC: 8.8 K/uL (ref 4.0–10.5)

## 2024-04-30 LAB — COMPREHENSIVE METABOLIC PANEL WITH GFR
ALT: 26 U/L (ref 0–35)
AST: 22 U/L (ref 0–37)
Albumin: 4.7 g/dL (ref 3.5–5.2)
Alkaline Phosphatase: 67 U/L (ref 39–117)
BUN: 15 mg/dL (ref 6–23)
CO2: 26 meq/L (ref 19–32)
Calcium: 10.4 mg/dL (ref 8.4–10.5)
Chloride: 103 meq/L (ref 96–112)
Creatinine, Ser: 0.79 mg/dL (ref 0.40–1.20)
GFR: 70.99 mL/min
Glucose, Bld: 198 mg/dL — ABNORMAL HIGH (ref 70–99)
Potassium: 4 meq/L (ref 3.5–5.1)
Sodium: 138 meq/L (ref 135–145)
Total Bilirubin: 0.5 mg/dL (ref 0.2–1.2)
Total Protein: 7.9 g/dL (ref 6.0–8.3)

## 2024-04-30 LAB — LIPID PANEL
Cholesterol: 134 mg/dL (ref 0–200)
HDL: 46.5 mg/dL (ref >39.00)
LDL Cholesterol: 61 mg/dL (ref 0–99)
NonHDL: 87.61
Total CHOL/HDL Ratio: 3
Triglycerides: 132 mg/dL (ref 0.0–149.0)
VLDL: 26.4 mg/dL (ref 0.0–40.0)

## 2024-04-30 LAB — TSH: TSH: 2.87 u[IU]/mL (ref 0.35–5.50)

## 2024-04-30 LAB — MICROALBUMIN / CREATININE URINE RATIO
Creatinine,U: 64.8 mg/dL
Microalb Creat Ratio: 255.6 mg/g — ABNORMAL HIGH (ref 0.0–30.0)
Microalb, Ur: 16.6 mg/dL — ABNORMAL HIGH (ref 0.0–1.9)

## 2024-04-30 LAB — HEMOGLOBIN A1C: Hgb A1c MFr Bld: 8.7 % — ABNORMAL HIGH (ref 4.6–6.5)

## 2024-04-30 MED ORDER — ALENDRONATE SODIUM 70 MG PO TABS: ORAL_TABLET | ORAL | 3 refills | Status: AC

## 2024-04-30 NOTE — Patient Instructions (Signed)
 It was great seeing you today   We will follow up with you regarding your lab work   Please let me know if you need anything

## 2024-04-30 NOTE — Progress Notes (Signed)
 Subjective:    Patient ID: Susan Schmidt, female    DOB: May 01, 1946, 78 y.o.   MRN: [id number]  HPI Patient presents for yearly preventative medicine examination. She is a 78 year old female who  has a past medical history of Dementia (HCC), DM (diabetes mellitus), type 2, uncontrolled, Hyperlipidemia, and Osteoporosis.  Her sister ETTER Meth)  is present to help provide history.   DM Type 2 - currently managed with glipizide  10mg  ER daily. Her sister reports that blood sugars have been in the 190-200's. Reports that she has not been snacking as much as she has in the past.  Lab Results  Component Value Date   HGBA1C 8.2 (A) 01/17/2024   HGBA1C 7.4 (A) 10/18/2023   HGBA1C 7.9 (A) 07/20/2023   HTN -  Managed with lisinopril  40 mg daily daily. She has not had any dizziness, lightheadedness, or blurred vision.  BP Readings from Last 3 Encounters:  04/30/24 (!) 148/80  01/17/24 132/84  12/26/23 121/79   Alzheimer's Dementia - was recently seen by Neurology and since her dementia is pretty severe she is not able to handle personal affairs or finances and has a hard time doing her ADLs it was recommended by neurology that she have a guardian or POA.  Her sister Meth reports that she went to a lawyer but the lawyer requested a letter from one of her providers in order to gain access to be a POA.  Hyperlipidemia - managed with Crestor  5 mg daily.  Lab Results  Component Value Date   CHOL 150 04/27/2023   HDL 54.70 04/27/2023   LDLCALC 71 04/27/2023   TRIG 120.0 04/27/2023   CHOLHDL 3 04/27/2023   Osteoporosis - takes Fosamax  70 mg weekly.   All immunizations and health maintenance protocols were reviewed with the patient and needed orders were placed.  Appropriate screening laboratory values were ordered for the patient including screening of hyperlipidemia, renal function and hepatic function.  Medication reconciliation,  past medical history, social history, problem list and  allergies were reviewed in detail with the patient  Goals were established with regard to weight loss, exercise, and  diet in compliance with medications Wt Readings from Last 3 Encounters:  04/30/24 155 lb (70.3 kg)  01/17/24 155 lb (70.3 kg)  12/26/23 156 lb (70.8 kg)   Review of Systems  Unable to perform ROS: Dementia   Past Medical History:  Diagnosis Date   Dementia (HCC)    DM (diabetes mellitus), type 2, uncontrolled    Hyperlipidemia    Osteoporosis     Social History   Socioeconomic History   Marital status: Single    Spouse name: Not on file   Number of children: Not on file   Years of education: Not on file   Highest education level: Not on file  Occupational History   Not on file  Tobacco Use   Smoking status: Former    Current packs/day: 0.00    Average packs/day: 2.0 packs/day for 12.0 years (24.0 ttl pk-yrs)    Types: Cigarettes    Start date: 07/11/1964    Quit date: 07/11/1976    Years since quitting: 47.8   Smokeless tobacco: Never  Vaping Use   Vaping status: Never Used  Substance and Sexual Activity   Alcohol use: No   Drug use: Never   Sexual activity: Not Currently  Other Topics Concern   Not on file  Social History Narrative   Pt lives with sister at  home.      Social Drivers of Health   Financial Resource Strain: Not on file  Food Insecurity: Not on file  Transportation Needs: Not on file  Physical Activity: Not on file  Stress: Not on file  Social Connections: Not on file  Intimate Partner Violence: Not on file    Past Surgical History:  Procedure Laterality Date   ANKLE SURGERY Right    COLONOSCOPY N/A 07/03/2019   Procedure: COLONOSCOPY;  Surgeon: Golda Claudis PENNER, MD;  Location: [facility];  Service: Endoscopy;  Laterality: N/A;  830   INTRAMEDULLARY (IM) NAIL INTERTROCHANTERIC Left 03/27/2020   Procedure: INTRAMEDULLARY (IM) NAIL INTERTROCHANTRIC;  Surgeon: Margrette Taft BRAVO, MD;  Location: [facility];  Service: Orthopedics;   Laterality: Left;   pilonidal cyst      Family History  Problem Relation Age of Onset   Diabetes Mother    Hypertension Mother    Hypertension Father    Heart attack Father    Hypertension Sister    Diabetes Sister    Diabetes Brother    Breast cancer Neg Hx    BRCA 1/2 Neg Hx     Allergies  Allergen Reactions   Strawberry Extract Swelling    Current Outpatient Medications on File Prior to Visit  Medication Sig Dispense Refill   acetaminophen  (TYLENOL ) 325 MG tablet Take 325 mg by mouth as needed.     alendronate  (FOSAMAX ) 70 MG tablet TAKE 1 TABLET EVERY WEEK IN THE MORNING 30 MIN BEFORE EATING WITH AN 8OZ GLASS OF WATER  (SIT UP 30 MIN) 12 tablet 3   Biotin 10000 MCG TABS Take 1 tablet daily by mouth.     blood glucose meter kit and supplies KIT Dispense based on patient and insurance preference. Use up to four times daily as directed. 1 each 0   Blood Glucose Monitoring Suppl (ACCU-CHEK GUIDE) w/Device KIT Use to check blood glucose TID 1 kit 0   Calcium  Carbonate-Vit D-Min (CALCIUM -VITAMIN D -MINERALS) 600-800 MG-UNIT CHEW Chew 1 tablet daily by mouth.     Cholecalciferol (VITAMIN D ) 50 MCG (2000 UT) tablet Take 2,000 Units by mouth daily.     cyanocobalamin  (VITAMIN B12) 500 MCG tablet Take 500 mcg by mouth daily.     Ferrous Sulfate (IRON ) 325 (65 Fe) MG TABS Take by mouth.     glipiZIDE  (GLUCOTROL  XL) 10 MG 24 hr tablet Take 1 tablet (10 mg total) by mouth daily with breakfast. 90 tablet 1   Lancets (ONETOUCH DELICA PLUS LANCET33G) MISC USE AS DIRECTED TO TEST BLOOD GLUCOSE UP TO FOUR TIMES DAILY. 300 each 0   lisinopril  (ZESTRIL ) 40 MG tablet Take 1 tablet (40 mg total) by mouth daily. 90 tablet 3   Multiple Vitamins-Minerals (MULTIVITAMIN WOMEN 50+) TABS Take 1 tablet daily by mouth.     Omega-3 Fatty Acids (SUPER OMEGA 3 EPA/DHA PO) Take 660 mg by mouth daily.     rosuvastatin  (CRESTOR ) 5 MG tablet TAKE ONE TABLET BY MOUTH DAILY 90 tablet 0   vitamin C (ASCORBIC ACID)  500 MG tablet Take 500 mg by mouth daily.     vitamin E 180 MG (400 UNITS) capsule Take 400 Units by mouth daily.     No current facility-administered medications on file prior to visit.    BP (!) 148/80   Pulse 74   Temp 98.1 F (36.7 C) (Oral)   Ht 5' 1 (1.549 m)   Wt 155 lb (70.3 kg)   SpO2 95%  BMI 29.29 kg/m       Objective:   Physical Exam Vitals and nursing note reviewed.  Constitutional:      General: She is not in acute distress.    Appearance: Normal appearance. She is not ill-appearing.  HENT:     Head: Normocephalic and atraumatic.     Right Ear: Tympanic membrane, ear canal and external ear normal. There is no impacted cerumen.     Left Ear: Tympanic membrane, ear canal and external ear normal. There is no impacted cerumen.     Nose: Nose normal. No congestion or rhinorrhea.     Mouth/Throat:     Mouth: Mucous membranes are moist.     Pharynx: Oropharynx is clear.  Eyes:     Extraocular Movements: Extraocular movements intact.     Conjunctiva/sclera: Conjunctivae normal.     Pupils: Pupils are equal, round, and reactive to light.  Neck:     Vascular: No carotid bruit.  Cardiovascular:     Rate and Rhythm: Normal rate and regular rhythm.     Pulses: Normal pulses.     Heart sounds: No murmur heard.    No friction rub. No gallop.  Pulmonary:     Effort: Pulmonary effort is normal.     Breath sounds: Normal breath sounds.  Abdominal:     General: Abdomen is flat. Bowel sounds are normal. There is no distension.     Palpations: Abdomen is soft. There is no mass.     Tenderness: There is no abdominal tenderness. There is no guarding or rebound.     Hernia: No hernia is present.  Musculoskeletal:        General: Normal range of motion.     Cervical back: Normal range of motion and neck supple.  Lymphadenopathy:     Cervical: No cervical adenopathy.  Skin:    General: Skin is warm and dry.     Capillary Refill: Capillary refill takes less than 2  seconds.  Neurological:     Mental Status: She is alert. Mental status is at baseline. She is disoriented.     Motor: Weakness present.     Gait: Gait abnormal (walks with rolling walker).  Psychiatric:        Mood and Affect: Mood normal.        Behavior: Behavior normal.        Thought Content: Thought content normal.        Cognition and Memory: Cognition is impaired. Memory is impaired. She exhibits impaired recent memory and impaired remote memory.        Judgment: Judgment normal.        Assessment & Plan:  1. Routine general medical examination at a health care facility (Primary) Today patient counseled on age appropriate routine health concerns for screening and prevention, each reviewed and up to date or declined. Immunizations reviewed and up to date or declined. Labs ordered and reviewed. Risk factors for depression reviewed and negative. Hearing function and visual acuity are intact. ADLs screened and addressed as needed. Functional ability and level of safety reviewed and appropriate. Education, counseling and referrals performed based on assessed risks today. Patient provided with a copy of personalized plan for preventive services. - Follow up in one year or sooner if needed  2. Diabetes mellitus treated with oral medication (HCC) - Likely need to increase Glipizide   - Follow up in 3 months  - CBC with Differential/Platelet; Future - Comprehensive metabolic panel with GFR; Future - Lipid  panel; Future - TSH; Future - Microalbumin/Creatinine Ratio, Urine; Future - Hemoglobin A1c; Future  3. Primary hypertension - Controlled. No change in medication  - CBC with Differential/Platelet; Future - Comprehensive metabolic panel with GFR; Future - Lipid panel; Future - TSH; Future  4. Alzheimer's disease (HCC) - Follow up with Neurology as directed - CBC with Differential/Platelet; Future - Comprehensive metabolic panel with GFR; Future - Lipid panel; Future - TSH;  Future  5. Age-related osteoporosis without current pathological fracture  - alendronate  (FOSAMAX ) 70 MG tablet; TAKE 1 TABLET EVERY WEEK IN THE MORNING 30 MIN BEFORE EATING WITH AN 8OZ GLASS OF WATER  (SIT UP 30 MIN)  Dispense: 12 tablet; Refill: 3 - CBC with Differential/Platelet; Future - Comprehensive metabolic panel with GFR; Future - Lipid panel; Future - TSH; Future  6. Need for influenza vaccination  - Flu vaccine HIGH DOSE PF(Fluzone Trivalent)  Diago Haik, NP

## 2024-04-30 NOTE — Progress Notes (Signed)
 Susan Schmidt                                          MRN: [id number]   04/30/2024   The [facility] Quality Team Specialist reviewed this patient medical record for the purposes of chart review for care gap closure. The following were reviewed: abstraction for care gap closure-kidney health evaluation for diabetes:eGFR  and uACR.    [facility] Quality Team

## 2024-05-01 ENCOUNTER — Telehealth: Payer: Self-pay

## 2024-05-01 ENCOUNTER — Ambulatory Visit: Payer: Self-pay | Admitting: Adult Health

## 2024-05-01 ENCOUNTER — Other Ambulatory Visit: Payer: Self-pay | Admitting: Adult Health

## 2024-05-01 DIAGNOSIS — E119 Type 2 diabetes mellitus without complications: Secondary | ICD-10-CM

## 2024-05-01 MED ORDER — GLIPIZIDE ER 10 MG PO TB24: 10.0000 mg | ORAL_TABLET | Freq: Two times a day (BID) | ORAL | 1 refills | Status: AC

## 2024-05-01 NOTE — Telephone Encounter (Signed)
 Copied from CRM [id number]. Topic: Clinical - Prescription Issue >> May 01, 2024 10:05 AM [place] J wrote: Reason for CRM: Hoy is calling from the pharmacy to let Darleene know that the prescription for glipiZIDE  (GLUCOTROL  XL) 10 MG 24 hr tablet was sent fairly early. She received a refill on 04/16/24 and just had a recent refill sent in on 05/01/24. She would like a call back to know whether or not this was in error. She is also saying that the dose she's taking twice a day is high and also needs to confirm that as well.   [facility]: [phone]

## 2024-05-01 NOTE — Telephone Encounter (Signed)
 Pharmacist notified the Rx has been increased to twice a day.

## 2024-05-02 ENCOUNTER — Telehealth: Payer: Self-pay | Admitting: *Deleted

## 2024-05-02 NOTE — Telephone Encounter (Signed)
 Copied from CRM [id number]. Topic: Clinical - Lab/Test Results >> May 01, 2024  5:12 PM [place] J wrote: Reason for CRM: Roselind Kalata (sister) is calling to follow up on a call she received from [place]. Due to [facility] not being on the [facility] list, I am unable to relay lab results at this time. She informed me that paperwork was filled out yesterday in the office so she can receive patient's information.  Please call Roselind back at [phone]

## 2024-05-02 NOTE — Telephone Encounter (Signed)
 Copied from CRM [id number]. Topic: Clinical - Lab/Test Results >> May 02, 2024  3:43 PM Revonda D wrote: Reason for CRM: Pt's sister Roselind has been made aware of pt's lab results. Roselind also wanted to confirm if a copy of the pt's blood work will be mailed to the address on file.

## 2024-05-03 NOTE — Telephone Encounter (Signed)
 Louis notified that labs have been sent via mail.

## 2024-05-29 ENCOUNTER — Other Ambulatory Visit: Payer: Self-pay | Admitting: Adult Health

## 2024-05-29 DIAGNOSIS — E118 Type 2 diabetes mellitus with unspecified complications: Secondary | ICD-10-CM

## 2024-05-29 NOTE — Telephone Encounter (Signed)
  The original prescription was discontinued on 02/16/2024 by Vicci Marjorie SAUNDERS, CMA. Renewing this prescription may not be appropriate

## 2024-05-30 ENCOUNTER — Other Ambulatory Visit: Payer: Self-pay | Admitting: Adult Health

## 2024-05-30 DIAGNOSIS — E118 Type 2 diabetes mellitus with unspecified complications: Secondary | ICD-10-CM

## 2024-06-15 ENCOUNTER — Other Ambulatory Visit: Payer: Self-pay | Admitting: Adult Health

## 2024-06-15 DIAGNOSIS — I10 Essential (primary) hypertension: Secondary | ICD-10-CM

## 2024-07-01 ENCOUNTER — Other Ambulatory Visit: Payer: Self-pay | Admitting: Adult Health

## 2024-07-30 ENCOUNTER — Encounter: Payer: Self-pay | Admitting: Adult Health

## 2024-07-30 ENCOUNTER — Ambulatory Visit: Admitting: Adult Health

## 2024-07-30 VITALS — BP 120/70 | HR 71 | Temp 97.9°F | Ht 61.0 in | Wt 149.0 lb

## 2024-07-30 DIAGNOSIS — Z7984 Long term (current) use of oral hypoglycemic drugs: Secondary | ICD-10-CM | POA: Diagnosis not present

## 2024-07-30 DIAGNOSIS — E119 Type 2 diabetes mellitus without complications: Secondary | ICD-10-CM

## 2024-07-30 DIAGNOSIS — I10 Essential (primary) hypertension: Secondary | ICD-10-CM | POA: Diagnosis not present

## 2024-07-30 LAB — POCT GLYCOSYLATED HEMOGLOBIN (HGB A1C): Hemoglobin A1C: 8.9 % — AB (ref 4.0–5.6)

## 2024-07-30 MED ORDER — METFORMIN HCL ER 500 MG PO TB24: 500.0000 mg | ORAL_TABLET | Freq: Every day | ORAL | 0 refills | Status: AC

## 2024-07-30 NOTE — Progress Notes (Addendum)
 "  Subjective:    Patient ID: Susan Schmidt, female    DOB: July 12, 1945, 79 y.o.   MRN: [id number]  HPI 79 year old female who  has a past medical history of Dementia (HCC), DM (diabetes mellitus), type 2, uncontrolled, HTN (hypertension), Hyperlipidemia, and Osteoporosis.  Her sister ETTER Meth)  is present to help provide history.   DM Type 2 - currently managed with glipizide  10mg  ER BID Her sister reports that blood sugars have been in the 190-200's. Reports that she has not been snacking as much as she has in the past. Lab Results  Component Value Date   HGBA1C 8.7 (H) 04/30/2024   HGBA1C 8.2 (A) 01/17/2024   HGBA1C 7.4 (A) 10/18/2023   Wt Readings from Last 3 Encounters:  07/30/24 149 lb (67.6 kg)  04/30/24 155 lb (70.3 kg)  01/17/24 155 lb (70.3 kg)    HTN -  Managed with lisinopril  40 mg daily daily. She has not had any dizziness, lightheadedness, or blurred vision.  BP Readings from Last 3 Encounters:  07/30/24 120/70  04/30/24 122/80  01/17/24 132/84    Review of Systems See HPI   Past Medical History:  Diagnosis Date   Dementia (HCC)    DM (diabetes mellitus), type 2, uncontrolled    HTN (hypertension)    Hyperlipidemia    Osteoporosis     Social History   Socioeconomic History   Marital status: Single    Spouse name: Not on file   Number of children: Not on file   Years of education: Not on file   Highest education level: Not on file  Occupational History   Not on file  Tobacco Use   Smoking status: Former    Current packs/day: 0.00    Average packs/day: 2.0 packs/day for 12.0 years (24.0 ttl pk-yrs)    Types: Cigarettes    Start date: 07/11/1964    Quit date: 07/11/1976    Years since quitting: 48.0   Smokeless tobacco: Never  Vaping Use   Vaping status: Never Used  Substance and Sexual Activity   Alcohol use: No   Drug use: Never   Sexual activity: Not Currently  Other Topics Concern   Not on file  Social History Narrative   Pt lives  with sister at home.      Social Drivers of Health   Tobacco Use: Medium Risk (07/30/2024)   Patient History    Smoking Tobacco Use: Former    Smokeless Tobacco Use: Never    Passive Exposure: Not on file  Financial Resource Strain: Not on file  Food Insecurity: Not on file  Transportation Needs: Not on file  Physical Activity: Not on file  Stress: Not on file  Social Connections: Not on file  Intimate Partner Violence: Not on file  Depression (PHQ2-9): Low Risk (04/30/2024)   Depression (PHQ2-9)    PHQ-2 Score: 4  Alcohol Screen: Not on file  Housing: Not on file  Utilities: Not on file  Health Literacy: Not on file    Past Surgical History:  Procedure Laterality Date   ANKLE SURGERY Right    COLONOSCOPY N/A 07/03/2019   Procedure: COLONOSCOPY;  Surgeon: Golda Claudis PENNER, MD;  Location: [facility];  Service: Endoscopy;  Laterality: N/A;  830   INTRAMEDULLARY (IM) NAIL INTERTROCHANTERIC Left 03/27/2020   Procedure: INTRAMEDULLARY (IM) NAIL INTERTROCHANTRIC;  Surgeon: Margrette Taft BRAVO, MD;  Location: [facility];  Service: Orthopedics;  Laterality: Left;   pilonidal cyst  Family History  Problem Relation Age of Onset   Diabetes Mother    Hypertension Mother    Hypertension Father    Heart attack Father    Hypertension Sister    Diabetes Sister    Diabetes Brother    Breast cancer Neg Hx    BRCA 1/2 Neg Hx     Allergies[1]  Medications Ordered Prior to Encounter[2]  BP 120/70   Pulse 71   Temp 97.9 F (36.6 C) (Oral)   Ht 5' 1 (1.549 m)   Wt 149 lb (67.6 kg)   SpO2 92%   BMI 28.15 kg/m       Objective:   Physical Exam Vitals and nursing note reviewed.  Constitutional:      Appearance: Normal appearance.  Cardiovascular:     Rate and Rhythm: Normal rate and regular rhythm.     Pulses: Normal pulses.     Heart sounds: Normal heart sounds.  Pulmonary:     Effort: Pulmonary effort is normal.     Breath sounds: Normal breath sounds.  Skin:     General: Skin is warm and dry.  Neurological:     Mental Status: She is alert and oriented to person, place, and time. Mental status is at baseline.     Gait: Gait abnormal (slow steady gait with walker).  Psychiatric:        Mood and Affect: Mood normal.        Speech: Speech is delayed.        Behavior: Behavior is slowed.        Cognition and Memory: Cognition is impaired. Memory is impaired. She exhibits impaired recent memory and impaired remote memory.        Judgment: Judgment normal.        Assessment & Plan:  1. Diabetes mellitus treated with oral medication (HCC) (Primary)  - POC HgB A1c- 8.9 - has increased.  - I would like to keep her below 9. Will add Metformin  to her regimen and have her follow up in 3 months  - metFORMIN  (GLUCOPHAGE -XR) 500 MG 24 hr tablet; Take 1 tablet (500 mg total) by mouth daily with breakfast.  Dispense: 90 tablet; Refill: 0  2. Primary hypertension - Well controlled. No change in medication   Darleene Shape, NP       [1]  Allergies Allergen Reactions   Strawberry Extract Swelling  [2]  Current Outpatient Medications on File Prior to Visit  Medication Sig Dispense Refill   ACCU-CHEK GUIDE TEST test strip USE AS DIRECTED TO TEST BLOOD GLUCOSE UP TO FOUR TIMES DAILY. 300 strip 0   acetaminophen  (TYLENOL ) 325 MG tablet Take 325 mg by mouth as needed.     alendronate  (FOSAMAX ) 70 MG tablet TAKE 1 TABLET EVERY WEEK IN THE MORNING 30 MIN BEFORE EATING WITH AN 8OZ GLASS OF WATER  (SIT UP 30 MIN) 12 tablet 3   Biotin 10000 MCG TABS Take 1 tablet daily by mouth.     blood glucose meter kit and supplies KIT Dispense based on patient and insurance preference. Use up to four times daily as directed. 1 each 0   Blood Glucose Monitoring Suppl (ACCU-CHEK GUIDE) w/Device KIT Use to check blood glucose TID 1 kit 0   Calcium  Carbonate-Vit D-Min (CALCIUM -VITAMIN D -MINERALS) 600-800 MG-UNIT CHEW Chew 1 tablet daily by mouth.     Cholecalciferol (VITAMIN D )  50 MCG (2000 UT) tablet Take 2,000 Units by mouth daily.     cyanocobalamin  (VITAMIN B12) 500  MCG tablet Take 500 mcg by mouth daily.     Ferrous Sulfate (IRON ) 325 (65 Fe) MG TABS Take by mouth.     glipiZIDE  (GLUCOTROL  XL) 10 MG 24 hr tablet Take 1 tablet (10 mg total) by mouth 2 (two) times daily. 180 tablet 1   Lancets (ONETOUCH DELICA PLUS LANCET33G) MISC USE AS DIRECTED TO TEST BLOOD GLUCOSE UP TO FOUR TIMES DAILY. 300 each 0   lisinopril  (ZESTRIL ) 40 MG tablet Take 1 tablet (40 mg total) by mouth daily. 90 tablet 3   Multiple Vitamins-Minerals (MULTIVITAMIN WOMEN 50+) TABS Take 1 tablet daily by mouth.     Omega-3 Fatty Acids (SUPER OMEGA 3 EPA/DHA PO) Take 660 mg by mouth daily.     rosuvastatin  (CRESTOR ) 5 MG tablet TAKE ONE TABLET BY MOUTH DAILY 90 tablet 1   vitamin C (ASCORBIC ACID) 500 MG tablet Take 500 mg by mouth daily.     vitamin E 180 MG (400 UNITS) capsule Take 400 Units by mouth daily.     No current facility-administered medications on file prior to visit.   "

## 2024-07-30 NOTE — Patient Instructions (Addendum)
 It was great seeing you today   I am going to add Metformin  500 mg daily to your regimen - take this in the morning   Follow up in 3 months

## 2024-08-03 ENCOUNTER — Other Ambulatory Visit: Payer: Self-pay | Admitting: Adult Health

## 2024-08-03 DIAGNOSIS — E118 Type 2 diabetes mellitus with unspecified complications: Secondary | ICD-10-CM

## 2024-10-29 ENCOUNTER — Ambulatory Visit: Admitting: Adult Health
